# Patient Record
Sex: Male | Born: 1945 | Race: White | Hispanic: No | Marital: Married | Smoking: Former smoker
Health system: Southern US, Community
[De-identification: ages and names within clinical notes are randomized; demographics above are authoritative.]

## PROBLEM LIST (undated history)

## (undated) DIAGNOSIS — F419 Anxiety disorder, unspecified: Secondary | ICD-10-CM

## (undated) DIAGNOSIS — K219 Gastro-esophageal reflux disease without esophagitis: Secondary | ICD-10-CM

## (undated) DIAGNOSIS — M1990 Unspecified osteoarthritis, unspecified site: Secondary | ICD-10-CM

## (undated) DIAGNOSIS — N189 Chronic kidney disease, unspecified: Secondary | ICD-10-CM

## (undated) DIAGNOSIS — I4891 Unspecified atrial fibrillation: Secondary | ICD-10-CM

## (undated) DIAGNOSIS — I10 Essential (primary) hypertension: Secondary | ICD-10-CM

## (undated) DIAGNOSIS — M109 Gout, unspecified: Secondary | ICD-10-CM

## (undated) DIAGNOSIS — I509 Heart failure, unspecified: Secondary | ICD-10-CM

## (undated) HISTORY — PX: PARS PLANA VITRECTOMY: SHX2166

## (undated) HISTORY — PX: CARDIAC VALVE REPLACEMENT: SHX585

## (undated) HISTORY — PX: AORTIC VALVE REPLACEMENT: SHX41

## (undated) HISTORY — PX: TONSILLECTOMY: SUR1361

## (undated) HISTORY — PX: PILONIDAL CYST DRAINAGE: SHX743

## (undated) HISTORY — PX: SHOULDER ARTHROSCOPY W/ BANKART PROCEDURE: SHX2397

---

## 2005-12-07 ENCOUNTER — Ambulatory Visit: Payer: Self-pay | Admitting: Gastroenterology

## 2005-12-14 ENCOUNTER — Ambulatory Visit: Payer: Self-pay | Admitting: Gastroenterology

## 2008-10-26 ENCOUNTER — Emergency Department (HOSPITAL_BASED_OUTPATIENT_CLINIC_OR_DEPARTMENT_OTHER): Admission: EM | Admit: 2008-10-26 | Discharge: 2008-10-26 | Payer: Self-pay | Admitting: Emergency Medicine

## 2008-10-26 ENCOUNTER — Ambulatory Visit: Payer: Self-pay | Admitting: Diagnostic Radiology

## 2009-04-01 ENCOUNTER — Encounter: Admission: RE | Admit: 2009-04-01 | Discharge: 2009-04-01 | Payer: Self-pay | Admitting: Chiropractor

## 2012-03-26 ENCOUNTER — Emergency Department (HOSPITAL_BASED_OUTPATIENT_CLINIC_OR_DEPARTMENT_OTHER)
Admission: EM | Admit: 2012-03-26 | Discharge: 2012-03-26 | Disposition: A | Discharge status: Home / Self Care | Payer: Medicare Other | Attending: Emergency Medicine | Admitting: Emergency Medicine

## 2012-03-26 ENCOUNTER — Encounter (HOSPITAL_BASED_OUTPATIENT_CLINIC_OR_DEPARTMENT_OTHER): Payer: Self-pay | Admitting: *Deleted

## 2012-03-26 DIAGNOSIS — Z87891 Personal history of nicotine dependence: Secondary | ICD-10-CM | POA: Insufficient documentation

## 2012-03-26 DIAGNOSIS — S61209A Unspecified open wound of unspecified finger without damage to nail, initial encounter: Secondary | ICD-10-CM | POA: Insufficient documentation

## 2012-03-26 DIAGNOSIS — I10 Essential (primary) hypertension: Secondary | ICD-10-CM | POA: Insufficient documentation

## 2012-03-26 DIAGNOSIS — I509 Heart failure, unspecified: Secondary | ICD-10-CM | POA: Insufficient documentation

## 2012-03-26 DIAGNOSIS — W260XXA Contact with knife, initial encounter: Secondary | ICD-10-CM | POA: Insufficient documentation

## 2012-03-26 DIAGNOSIS — S61219A Laceration without foreign body of unspecified finger without damage to nail, initial encounter: Secondary | ICD-10-CM

## 2012-03-26 DIAGNOSIS — Z7901 Long term (current) use of anticoagulants: Secondary | ICD-10-CM | POA: Insufficient documentation

## 2012-03-26 DIAGNOSIS — Z7982 Long term (current) use of aspirin: Secondary | ICD-10-CM | POA: Insufficient documentation

## 2012-03-26 DIAGNOSIS — W261XXA Contact with sword or dagger, initial encounter: Secondary | ICD-10-CM | POA: Insufficient documentation

## 2012-03-26 HISTORY — DX: Heart failure, unspecified: I50.9

## 2012-03-26 HISTORY — DX: Essential (primary) hypertension: I10

## 2012-03-26 MED ORDER — LIDOCAINE HCL 2 % IJ SOLN
20.0000 mL | Freq: Once | INTRAMUSCULAR | Status: AC
  Administered 2012-03-26: 400 mg via INTRADERMAL

## 2012-03-26 MED ORDER — LIDOCAINE HCL 2 % IJ SOLN
INTRAMUSCULAR | Status: AC
  Administered 2012-03-26: 400 mg via INTRADERMAL
  Filled 2012-03-26: qty 1

## 2012-03-26 NOTE — ED Provider Notes (Signed)
Medical screening examination/treatment/procedure(s) were performed by non-physician practitioner and as supervising physician I was immediately available for consultation/collaboration.   Stepen Prins, MD 03/26/12 1636 

## 2012-03-26 NOTE — ED Notes (Signed)
Padded baseball finger splint applied to index finger; sutures placed via PA; wound cleaned and dressed with gauze and bacitracin.

## 2012-03-26 NOTE — ED Provider Notes (Signed)
History     CSN: [id number]  Arrival date & time 03/26/12  1518   First MD Initiated Contact with Patient 03/26/12 1554      Chief Complaint  Patient presents with  . Laceration    (Consider location/radiation/quality/duration/timing/severity/associated sxs/prior treatment) Patient is a 66 y.o. male presenting with skin laceration. The history is provided by the patient. No language interpreter was used.  Laceration  The incident occurred 1 to 2 hours ago. The laceration is located on the left hand. Size: 7mm. The laceration mechanism was a a clean knife. The pain is at a severity of 0/10. The pain is mild. The pain has been constant since onset. He reports no foreign bodies present. His tetanus status is UTD.  Pt cut finger on a nife while cooking chicken wings  Past Medical History  Diagnosis Date  . CHF (congestive heart failure)   . Hypertension     Past Surgical History  Procedure Date  . Tonsillectomy   . Shoulder arthroscopy w/ bankhart procedure     No family history on file.  History  Substance Use Topics  . Smoking status: Former Games developer  . Smokeless tobacco: Not on file  . Alcohol Use: No      Review of Systems  Skin: Positive for wound.  All other systems reviewed and are negative.    Allergies  Review of patient's allergies indicates no known allergies.  Home Medications   Current Outpatient Rx  Name Route Sig Dispense Refill  . ASPIRIN 81 MG PO TABS Oral Take 81 mg by mouth daily.    . ATORVASTATIN CALCIUM PO Oral Take 0.5 tablets by mouth daily.    Marland Kitchen CITALOPRAM HYDROBROMIDE 40 MG PO TABS Oral Take 20 mg by mouth daily.    Marland Kitchen HYDROCHLOROTHIAZIDE PO Oral Take 1 tablet by mouth every other day.    Marland Kitchen LISINOPRIL PO Oral Take 0.5 tablets by mouth daily.    Marland Kitchen FISH OIL PO Oral Take 2 capsules by mouth 2 (two) times daily.    Marland Kitchen OMEPRAZOLE 20 MG PO CPDR Oral Take 20 mg by mouth daily.    Marland Kitchen COUMADIN PO Oral Take 0.5-1 tablets by mouth daily. 1 tablet  on Monday and Friday and 0.5 tablet all other days      BP 123/74  Pulse 54  Temp 98.3 F (36.8 C) (Oral)  Resp 20  SpO2 94%  Physical Exam  Nursing note and vitals reviewed. Constitutional: He appears well-developed and well-nourished.  Musculoskeletal: He exhibits tenderness.       7mm laceration left index finger oozing  Neurological: He is alert.  Skin: Skin is warm.    ED Course  LACERATION REPAIR Date/Time: 03/26/2012 4:13 PM Performed by: Elson Areas Authorized by: Elson Areas Consent: Verbal consent not obtained. Risks and benefits: risks, benefits and alternatives were discussed Consent given by: patient Patient understanding: patient states understanding of the procedure being performed Patient identity confirmed: verbally with patient Time out: Immediately prior to procedure a "time out" was called to verify the correct patient, procedure, equipment, support staff and site/side marked as required. Body area: lower extremity Laceration length: 0.7 cm Foreign bodies: metal Tendon involvement: none Nerve involvement: none Anesthesia: local infiltration Preparation: Patient was prepped and draped in the usual sterile fashion. Irrigation solution: saline Skin closure: 5-0 Prolene Number of sutures: 2 Technique: simple Approximation difficulty: simple Patient tolerance: Patient tolerated the procedure well with no immediate complications.   (including critical care time)  Labs Reviewed - No data to display No results found.   1. Laceration of finger       MDM  Suture removal in 7 days        Lonia Skinner [place], [place] 03/26/12 1614

## 2012-03-26 NOTE — ED Notes (Signed)
Cut his left index finger with a kitchen knife. Patient on coumadin.

## 2012-04-03 ENCOUNTER — Encounter (HOSPITAL_BASED_OUTPATIENT_CLINIC_OR_DEPARTMENT_OTHER): Payer: Self-pay | Admitting: *Deleted

## 2012-04-03 ENCOUNTER — Emergency Department (HOSPITAL_BASED_OUTPATIENT_CLINIC_OR_DEPARTMENT_OTHER)
Admission: EM | Admit: 2012-04-03 | Discharge: 2012-04-03 | Disposition: A | Discharge status: Home / Self Care | Payer: Medicare Other | Attending: Emergency Medicine | Admitting: Emergency Medicine

## 2012-04-03 DIAGNOSIS — I509 Heart failure, unspecified: Secondary | ICD-10-CM | POA: Insufficient documentation

## 2012-04-03 DIAGNOSIS — Z4802 Encounter for removal of sutures: Secondary | ICD-10-CM | POA: Insufficient documentation

## 2012-04-03 DIAGNOSIS — Z87891 Personal history of nicotine dependence: Secondary | ICD-10-CM | POA: Insufficient documentation

## 2012-04-03 DIAGNOSIS — I10 Essential (primary) hypertension: Secondary | ICD-10-CM | POA: Insufficient documentation

## 2012-04-03 NOTE — ED Provider Notes (Signed)
History     CSN: [id number]  Arrival date & time 04/03/12  1126   First MD Initiated Contact with Patient 04/03/12 1134      Chief Complaint  Patient presents with  . Suture / Staple Removal    (Consider location/radiation/quality/duration/timing/severity/associated sxs/prior treatment) HPI  Left 2nd finger with sutures placed 8 days ago.  No complaints.  Here for suture removal.   Past Medical History  Diagnosis Date  . CHF (congestive heart failure)   . Hypertension     Past Surgical History  Procedure Date  . Tonsillectomy   . Shoulder arthroscopy w/ bankhart procedure     No family history on file.  History  Substance Use Topics  . Smoking status: Former Games developer  . Smokeless tobacco: Not on file  . Alcohol Use: No      Review of Systems  All other systems reviewed and are negative.    Allergies  Review of patient's allergies indicates no known allergies.  Home Medications   Current Outpatient Rx  Name Route Sig Dispense Refill  . ASPIRIN 81 MG PO TABS Oral Take 81 mg by mouth daily.    . ATORVASTATIN CALCIUM PO Oral Take 0.5 tablets by mouth daily.    Marland Kitchen CITALOPRAM HYDROBROMIDE 40 MG PO TABS Oral Take 20 mg by mouth daily.    Marland Kitchen HYDROCHLOROTHIAZIDE PO Oral Take 1 tablet by mouth every other day.    Marland Kitchen LISINOPRIL PO Oral Take 0.5 tablets by mouth daily.    Marland Kitchen FISH OIL PO Oral Take 2 capsules by mouth 2 (two) times daily.    Marland Kitchen OMEPRAZOLE 20 MG PO CPDR Oral Take 20 mg by mouth daily.    Marland Kitchen COUMADIN PO Oral Take 0.5-1 tablets by mouth daily. 1 tablet on Monday and Friday and 0.5 tablet all other days      BP 170/78  Pulse 47  Temp 97.5 F (36.4 C) (Oral)  Resp 18  SpO2 99%  Physical Exam  Nursing note and vitals reviewed. Constitutional: He appears well-developed.  Musculoskeletal:       Left 2nd finger with 3 sutures.  Well healing,  Full arom.  No erythema.     ED Course  Procedures (including critical care time)  Labs Reviewed - No data to  display No results found.   No diagnosis found.    MDM        Hilario Quarry, MD 04/03/12 [phone]

## 2012-04-03 NOTE — ED Notes (Signed)
Sutures x 2 removed from pts left index finger. BP noted states he has not taken his BP medication today but is on his way home to take it now.

## 2012-04-03 NOTE — ED Notes (Signed)
Here for suture removal. Healing laceration to his left index finger. Sutures x 2 intact.

## 2014-01-20 ENCOUNTER — Emergency Department (HOSPITAL_BASED_OUTPATIENT_CLINIC_OR_DEPARTMENT_OTHER): Payer: Medicare Other

## 2014-01-20 ENCOUNTER — Encounter (HOSPITAL_BASED_OUTPATIENT_CLINIC_OR_DEPARTMENT_OTHER): Payer: Self-pay | Admitting: Emergency Medicine

## 2014-01-20 ENCOUNTER — Emergency Department (HOSPITAL_BASED_OUTPATIENT_CLINIC_OR_DEPARTMENT_OTHER)
Admission: EM | Admit: 2014-01-20 | Discharge: 2014-01-20 | Disposition: A | Discharge status: Home / Self Care | Payer: Medicare Other | Attending: Emergency Medicine | Admitting: Emergency Medicine

## 2014-01-20 DIAGNOSIS — Y9289 Other specified places as the place of occurrence of the external cause: Secondary | ICD-10-CM | POA: Diagnosis not present

## 2014-01-20 DIAGNOSIS — Z87891 Personal history of nicotine dependence: Secondary | ICD-10-CM | POA: Insufficient documentation

## 2014-01-20 DIAGNOSIS — I509 Heart failure, unspecified: Secondary | ICD-10-CM | POA: Insufficient documentation

## 2014-01-20 DIAGNOSIS — S0093XA Contusion of unspecified part of head, initial encounter: Secondary | ICD-10-CM

## 2014-01-20 DIAGNOSIS — W1809XA Striking against other object with subsequent fall, initial encounter: Secondary | ICD-10-CM | POA: Diagnosis not present

## 2014-01-20 DIAGNOSIS — Z7901 Long term (current) use of anticoagulants: Secondary | ICD-10-CM | POA: Insufficient documentation

## 2014-01-20 DIAGNOSIS — S0990XA Unspecified injury of head, initial encounter: Secondary | ICD-10-CM | POA: Diagnosis present

## 2014-01-20 DIAGNOSIS — S0003XA Contusion of scalp, initial encounter: Secondary | ICD-10-CM | POA: Diagnosis not present

## 2014-01-20 DIAGNOSIS — I10 Essential (primary) hypertension: Secondary | ICD-10-CM | POA: Diagnosis not present

## 2014-01-20 DIAGNOSIS — S199XXA Unspecified injury of neck, initial encounter: Secondary | ICD-10-CM

## 2014-01-20 DIAGNOSIS — S0083XA Contusion of other part of head, initial encounter: Principal | ICD-10-CM | POA: Insufficient documentation

## 2014-01-20 DIAGNOSIS — S0993XA Unspecified injury of face, initial encounter: Secondary | ICD-10-CM | POA: Insufficient documentation

## 2014-01-20 DIAGNOSIS — S1093XA Contusion of unspecified part of neck, initial encounter: Principal | ICD-10-CM

## 2014-01-20 DIAGNOSIS — I4891 Unspecified atrial fibrillation: Secondary | ICD-10-CM | POA: Insufficient documentation

## 2014-01-20 DIAGNOSIS — Z79899 Other long term (current) drug therapy: Secondary | ICD-10-CM | POA: Diagnosis not present

## 2014-01-20 DIAGNOSIS — W19XXXA Unspecified fall, initial encounter: Secondary | ICD-10-CM

## 2014-01-20 DIAGNOSIS — Y9301 Activity, walking, marching and hiking: Secondary | ICD-10-CM | POA: Insufficient documentation

## 2014-01-20 DIAGNOSIS — R011 Cardiac murmur, unspecified: Secondary | ICD-10-CM | POA: Insufficient documentation

## 2014-01-20 DIAGNOSIS — Z7982 Long term (current) use of aspirin: Secondary | ICD-10-CM | POA: Insufficient documentation

## 2014-01-20 LAB — CBC WITH DIFFERENTIAL/PLATELET
BASOS ABS: 0 10*3/uL (ref 0.0–0.1)
BASOS PCT: 0 % (ref 0–1)
EOS ABS: 0.2 10*3/uL (ref 0.0–0.7)
EOS PCT: 3 % (ref 0–5)
HCT: 44.4 % (ref 39.0–52.0)
Hemoglobin: 15.5 g/dL (ref 13.0–17.0)
Lymphocytes Relative: 18 % (ref 12–46)
Lymphs Abs: 1.3 10*3/uL (ref 0.7–4.0)
MCH: 32.8 pg (ref 26.0–34.0)
MCHC: 34.9 g/dL (ref 30.0–36.0)
MCV: 93.9 fL (ref 78.0–100.0)
Monocytes Absolute: 0.5 10*3/uL (ref 0.1–1.0)
Monocytes Relative: 6 % (ref 3–12)
NEUTROS PCT: 73 % (ref 43–77)
Neutro Abs: 5.3 10*3/uL (ref 1.7–7.7)
PLATELETS: 205 10*3/uL (ref 150–400)
RBC: 4.73 MIL/uL (ref 4.22–5.81)
RDW: 12.9 % (ref 11.5–15.5)
WBC: 7.2 10*3/uL (ref 4.0–10.5)

## 2014-01-20 LAB — PROTIME-INR
INR: 2.71 — AB (ref 0.00–1.49)
PROTHROMBIN TIME: 27.8 s — AB (ref 11.6–15.2)

## 2014-01-20 LAB — BASIC METABOLIC PANEL
BUN: 17 mg/dL (ref 6–23)
CALCIUM: 10 mg/dL (ref 8.4–10.5)
CO2: 22 mEq/L (ref 19–32)
Chloride: 102 mEq/L (ref 96–112)
Creatinine, Ser: 1.3 mg/dL (ref 0.50–1.35)
GFR, EST AFRICAN AMERICAN: 64 mL/min — AB (ref >90)
GFR, EST NON AFRICAN AMERICAN: 55 mL/min — AB (ref >90)
GLUCOSE: 90 mg/dL (ref 70–99)
POTASSIUM: 4 meq/L (ref 3.7–5.3)
SODIUM: 141 meq/L (ref 137–147)

## 2014-01-20 NOTE — ED Notes (Signed)
RN to room for d/c and patient left prior to receiving instructions or obtaining a signature.

## 2014-01-20 NOTE — ED Provider Notes (Signed)
CSN: [id number]     Arrival date & time 01/20/14  1803 History   First MD Initiated Contact with Patient 01/20/14 1914     Chief Complaint  Patient presents with  . Fall     (Consider location/radiation/quality/duration/timing/severity/associated sxs/prior Treatment) HPI Comments: 68 year old male with a history of A. fib on Coumadin who presents after a fall. He states he had a few alcoholic drinks while he was cooking dinner. As he was walking into the kitchen, he lost his balance and fell. He struck his head. Multiple times, he denied syncope. He also denied loss of consciousness. He denied symptoms of presyncope.  Although he did feel "dizzy", he further elaborated on this by describing a feeling of off balance. He had mild right lateral shoulder and neck pain. However, he denied headache or other injuries.   Patient is a 68 y.o. male presenting with fall.  Fall This is a new problem. The current episode started 1 to 2 hours ago. Episode frequency: once. The problem has been resolved. Pertinent negatives include no chest pain, no abdominal pain, no headaches and no shortness of breath. Associated symptoms comments: Dizzy - further described as feeling off balance. Nothing aggravates the symptoms. Nothing relieves the symptoms.    Past Medical History  Diagnosis Date  . CHF (congestive heart failure)   . Hypertension    Past Surgical History  Procedure Laterality Date  . Tonsillectomy    . Shoulder arthroscopy w/ bankart procedure     No family history on file. History  Substance Use Topics  . Smoking status: Former Games developer  . Smokeless tobacco: Not on file  . Alcohol Use: Yes    Review of Systems  Respiratory: Negative for shortness of breath.   Cardiovascular: Negative for chest pain.  Gastrointestinal: Negative for abdominal pain.  Neurological: Negative for headaches.  All other systems reviewed and are negative.     Allergies  Review of patient's allergies  indicates no known allergies.  Home Medications   Prior to Admission medications   Medication Sig Start Date End Date Taking? Authorizing Provider  aspirin 81 MG tablet Take 81 mg by mouth daily.    Historical Provider, MD  ATORVASTATIN CALCIUM PO Take 0.5 tablets by mouth daily.    Historical Provider, MD  citalopram (CELEXA) 40 MG tablet Take 20 mg by mouth daily.    Historical Provider, MD  HYDROCHLOROTHIAZIDE PO Take 1 tablet by mouth every other day.    Historical Provider, MD  LISINOPRIL PO Take 0.5 tablets by mouth daily.    Historical Provider, MD  Omega-3 Fatty Acids (FISH OIL PO) Take 2 capsules by mouth 2 (two) times daily.    Historical Provider, MD  omeprazole (PRILOSEC) 20 MG capsule Take 20 mg by mouth daily.    Historical Provider, MD  Warfarin Sodium (COUMADIN PO) Take 0.5-1 tablets by mouth daily. 1 tablet on Monday and Friday and 0.5 tablet all other days    Historical Provider, MD   BP 139/81  Pulse 58  Temp(Src) 97.9 F (36.6 C) (Oral)  Resp 20  SpO2 97% Physical Exam  Nursing note and vitals reviewed. Constitutional: He is oriented to person, place, and time. He appears well-developed and well-nourished. No distress.  HENT:  Head: Normocephalic and atraumatic.    Mouth/Throat: Oropharynx is clear and moist.  Eyes: Conjunctivae and EOM are normal. Pupils are equal, round, and reactive to light. No scleral icterus.  Neck: Normal range of motion. Neck supple. Muscular tenderness (right  lateral) present. No spinous process tenderness present. Normal range of motion present.  Cardiovascular: Normal rate, regular rhythm and intact distal pulses.   Murmur heard.  Systolic (Right sternal border) murmur is present with a grade of 2/6  Pulmonary/Chest: Effort normal and breath sounds normal. No stridor. No respiratory distress. He has no wheezes. He has no rales.  Abdominal: Soft. He exhibits no distension. There is no tenderness.  Musculoskeletal: Normal range of  motion. He exhibits no edema.  Neurological: He is alert and oriented to person, place, and time.  Skin: Skin is warm and dry. No rash noted.  Psychiatric: He has a normal mood and affect. His behavior is normal.    ED Course  Procedures (including critical care time) Labs Review Labs Reviewed - No data to display  Imaging Review Ct Head Wo Contrast  01/20/2014   CLINICAL DATA:  Fall, hit head.  Head pain.  Neck pain.  EXAM: CT HEAD WITHOUT CONTRAST  CT CERVICAL SPINE WITHOUT CONTRAST  TECHNIQUE: Multidetector CT imaging of the head and cervical spine was performed following the standard protocol without intravenous contrast. Multiplanar CT image reconstructions of the cervical spine were also generated.  COMPARISON:  None.  FINDINGS: CT HEAD FINDINGS  No evidence for acute infarction, hemorrhage, mass lesion, hydrocephalus, or extra-axial fluid. Cerebral and cerebellar atrophy somewhat advanced for age. Hypoattenuation of white matter consistent with chronic microvascular ischemic change.  Calvarium intact. No significant scalp hematoma. No sinus air-fluid level or mastoid fluid. Vascular calcification.  CT CERVICAL SPINE FINDINGS  There is no visible cervical spine fracture, traumatic subluxation, prevertebral soft tissue swelling, or intraspinal hematoma. Mild reversal of the normal cervical lordotic curve could be positional or due to spasm. There is moderately extensive spondylosis with disc space narrowing C3 -C7. No significant pannus. The lung apices demonstrate no pneumothorax. There is mild atherosclerosis.  IMPRESSION: Chronic changes as described.  No acute intracranial abnormality.  Cervical spondylosis. No cervical spine fracture or traumatic subluxation.   Electronically Signed   By: Davonna Belling M.D.   On: 01/20/2014 19:35   Ct Cervical Spine Wo Contrast  01/20/2014   CLINICAL DATA:  Fall, hit head.  Head pain.  Neck pain.  EXAM: CT HEAD WITHOUT CONTRAST  CT CERVICAL SPINE WITHOUT  CONTRAST  TECHNIQUE: Multidetector CT imaging of the head and cervical spine was performed following the standard protocol without intravenous contrast. Multiplanar CT image reconstructions of the cervical spine were also generated.  COMPARISON:  None.  FINDINGS: CT HEAD FINDINGS  No evidence for acute infarction, hemorrhage, mass lesion, hydrocephalus, or extra-axial fluid. Cerebral and cerebellar atrophy somewhat advanced for age. Hypoattenuation of white matter consistent with chronic microvascular ischemic change.  Calvarium intact. No significant scalp hematoma. No sinus air-fluid level or mastoid fluid. Vascular calcification.  CT CERVICAL SPINE FINDINGS  There is no visible cervical spine fracture, traumatic subluxation, prevertebral soft tissue swelling, or intraspinal hematoma. Mild reversal of the normal cervical lordotic curve could be positional or due to spasm. There is moderately extensive spondylosis with disc space narrowing C3 -C7. No significant pannus. The lung apices demonstrate no pneumothorax. There is mild atherosclerosis.  IMPRESSION: Chronic changes as described.  No acute intracranial abnormality.  Cervical spondylosis. No cervical spine fracture or traumatic subluxation.   Electronically Signed   By: Davonna Belling M.D.   On: 01/20/2014 19:35     EKG Interpretation   Date/Time:  Sunday January 20 2014 20:03:36 EDT Ventricular Rate:  52 PR Interval:  174 QRS Duration: 106 QT Interval:  480 QTC Calculation: 446 R Axis:   2 Text Interpretation:  Sinus bradycardia T wave abnormality, consider  lateral ischemia Abnormal ECG No old tracing to compare Confirmed by  [facility] LLCWOFFORD  MD, TREY ([id number]) on 01/20/2014 11:36:16 PM      MDM   Final diagnoses:  Fall  Head contusion    68 year old male who fell, striking his head. On Coumadin, INR therapeutic. Head CT negative for intracranial hemorrhage or other acute process. EKG shows T-wave inversions.  [facility],  which shows these T-wave inversions are old. On repeated questioning, he denies any symptoms that would be concerning for presyncope or syncope. His feeling of off balance was likely secondary to his alcohol intoxication. In the ED, he had a normal steady gait.  Discharged home with good return precautions. Advised PCP followup.    Candyce ChurnJohn David Burnis Halling III, MD 01/20/14 [phone]

## 2014-01-20 NOTE — ED Notes (Signed)
MD at bedside. 

## 2014-01-20 NOTE — ED Notes (Addendum)
After having a few drinks, patient slipped and fell, hitting head. Per EMS patient had LOC for a few seconds, alert and oriented. SOB is baseline for patient, aortic vale surgery scheduled for later this month. C/o minor neck pain. cbg 189 on truck

## 2014-01-20 NOTE — Discharge Instructions (Signed)

## 2014-03-18 ENCOUNTER — Encounter: Payer: Self-pay | Admitting: Specialist

## 2014-03-18 ENCOUNTER — Encounter: Payer: Self-pay | Admitting: Gastroenterology

## 2014-03-18 ENCOUNTER — Encounter: Payer: Self-pay | Admitting: *Deleted

## 2015-05-16 ENCOUNTER — Emergency Department (HOSPITAL_BASED_OUTPATIENT_CLINIC_OR_DEPARTMENT_OTHER)
Admission: EM | Admit: 2015-05-16 | Discharge: 2015-05-16 | Disposition: A | Discharge status: Home / Self Care | Payer: Medicare Other | Attending: Emergency Medicine | Admitting: Emergency Medicine

## 2015-05-16 ENCOUNTER — Encounter (HOSPITAL_BASED_OUTPATIENT_CLINIC_OR_DEPARTMENT_OTHER): Payer: Self-pay

## 2015-05-16 DIAGNOSIS — Y92810 Car as the place of occurrence of the external cause: Secondary | ICD-10-CM | POA: Insufficient documentation

## 2015-05-16 DIAGNOSIS — Z79899 Other long term (current) drug therapy: Secondary | ICD-10-CM | POA: Insufficient documentation

## 2015-05-16 DIAGNOSIS — Z7901 Long term (current) use of anticoagulants: Secondary | ICD-10-CM | POA: Insufficient documentation

## 2015-05-16 DIAGNOSIS — Z87891 Personal history of nicotine dependence: Secondary | ICD-10-CM | POA: Insufficient documentation

## 2015-05-16 DIAGNOSIS — Y9389 Activity, other specified: Secondary | ICD-10-CM | POA: Insufficient documentation

## 2015-05-16 DIAGNOSIS — S61210A Laceration without foreign body of right index finger without damage to nail, initial encounter: Secondary | ICD-10-CM | POA: Diagnosis not present

## 2015-05-16 DIAGNOSIS — I509 Heart failure, unspecified: Secondary | ICD-10-CM | POA: Diagnosis not present

## 2015-05-16 DIAGNOSIS — Z7982 Long term (current) use of aspirin: Secondary | ICD-10-CM | POA: Insufficient documentation

## 2015-05-16 DIAGNOSIS — Y998 Other external cause status: Secondary | ICD-10-CM | POA: Insufficient documentation

## 2015-05-16 DIAGNOSIS — Y288XXA Contact with other sharp object, undetermined intent, initial encounter: Secondary | ICD-10-CM | POA: Diagnosis not present

## 2015-05-16 DIAGNOSIS — I10 Essential (primary) hypertension: Secondary | ICD-10-CM | POA: Insufficient documentation

## 2015-05-16 DIAGNOSIS — S6991XA Unspecified injury of right wrist, hand and finger(s), initial encounter: Secondary | ICD-10-CM | POA: Diagnosis present

## 2015-05-16 NOTE — Discharge Instructions (Signed)
Please read and follow all provided instructions.  Your diagnoses today include:  1. Laceration of right index finger w/o foreign body w/o damage to nail, initial encounter    Tests performed today include:  Vital signs. See below for your results today.   Medications prescribed:   None   Take any prescribed medications only as directed.   Home care instructions:  Follow any educational materials and wound care instructions contained in this packet.   Keep affected area above the level of your heart when possible to minimize swelling. Wash area gently twice a day with warm soapy water. Do not apply alcohol or hydrogen peroxide. Cover the area if it draining or weeping.   Return instructions:  Return to the Emergency Department if you have:  Fever  Worsening pain  Worsening swelling of the wound  Pus draining from the wound  Redness of the skin that moves away from the wound, especially if it streaks away from the affected area   Any other emergent concerns  Your vital signs today were: BP 136/84 mmHg   Pulse 69   Temp(Src) 98.6 F (37 C) (Oral)   Ht  (1.753 m)   Wt 250 lb (113.399 kg)   BMI 36.90 kg/m2   SpO2 99% If your blood pressure (BP) was elevated above 135/85 this visit, please have this repeated by your doctor within one month. --------------

## 2015-05-16 NOTE — ED Provider Notes (Signed)
CSN: [id number]     Arrival date & time 05/16/15  1336 History   First MD Initiated Contact with Patient 05/16/15 1400     Chief Complaint  Patient presents with  . 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 Patient cut the finger on a piece of metal. He states that he initially cleaned with water. Bleeding was minimal. He put a bandage on it. Last night the patient played the piano without any difficulty. He presents today with an open wound. Tetanus was updated approximately one year ago. No other treatments prior to arrival. No drainage or discharge, redness, or increased pain today. Patient does not use steroids medications and does not have a history of diabetes or other immunocompromising states.  The history is provided by the patient.    Past Medical History  Diagnosis Date  . CHF (congestive heart failure)   . Hypertension    Past Surgical History  Procedure Laterality Date  . Tonsillectomy    . Shoulder arthroscopy w/ bankart procedure    . Cardiac valve replacement     No family history on file. Social History  Substance Use Topics  . Smoking status: Former Games developer  . Smokeless tobacco: None  . Alcohol Use: Yes    Review of Systems  Constitutional: Negative for fever.  Musculoskeletal: Negative for myalgias and arthralgias.  Skin: Positive for wound. Negative for color change.  Neurological: Negative for weakness.      Allergies  Review of patient's allergies indicates no known allergies.  Home Medications   Prior to Admission medications   Medication Sig Start Date End Date Taking? Authorizing Provider  METOPROLOL SUCCINATE ER PO Take by mouth.   Yes Historical Provider, MD  aspirin 81 MG  tablet Take 81 mg by mouth daily.    Historical Provider, MD  ATORVASTATIN CALCIUM PO Take 0.5 tablets by mouth daily.    Historical Provider, MD  citalopram (CELEXA) 40 MG tablet Take 20 mg by mouth daily.    Historical Provider, MD  LISINOPRIL PO Take 0.5 tablets by mouth daily.    Historical Provider, MD  Omega-3 Fatty Acids (FISH OIL PO) Take 2 capsules by mouth 2 (two) times daily.    Historical Provider, MD  omeprazole (PRILOSEC) 20 MG capsule Take 20 mg by mouth daily.    Historical Provider, MD  Warfarin Sodium (COUMADIN PO) Take 0.5-1 tablets by mouth daily. 1 tablet on Monday and Friday and 0.5 tablet all other days    Historical Provider, MD   BP 136/84 mmHg  Pulse 69  Temp(Src) 98.6 F (37 C) (Oral)  Ht  (1.753 m)  Wt 250 lb (113.399 kg)  BMI 36.90 kg/m2  SpO2 99% Physical Exam  Constitutional: He appears well-developed and well-nourished.  HENT:  Head: Normocephalic and atraumatic.  Eyes: Conjunctivae are normal.  Neck: Normal range of motion. Neck supple.  Pulmonary/Chest: No respiratory distress.  Neurological: He is alert.  Skin: Skin is warm and dry.  Patient with mildly gaping 1 cm linear laceration, open, to the volar aspect of the left index finger just proximal to the distal interphalangeal joint. No foreign bodies. Patient is able to fully flex the finger at all joints without significant pain. Base of wound appears clean.  Psychiatric: He has a normal  mood and affect.  Nursing note and vitals reviewed.   ED Course  Procedures (including critical care time) Labs Review Labs Reviewed - No data to display  Imaging Review No results found. I have personally reviewed and evaluated these images and lab results as part of my medical decision-making.   EKG Interpretation None       2:49 PM Patient seen and examined. Wound cleaned with dermal cleanser.  Vital signs reviewed and are as follows: BP 136/84 mmHg  Pulse 69  Temp(Src) 98.6 F (37 C)  (Oral)  Ht  (1.753 m)  Wt 250 lb (113.399 kg)  BMI 36.90 kg/m2  SpO2 99%  Discussed with patient that closure at this point is contraindicated due to elapsed time since injury. Patient counseled on wound care and signs and symptoms to return. He will keep. Bandaged while it is open.  Pt urged to return with worsening pain, worsening swelling, expanding area of redness or streaking up extremity, fever, or any other concerns. Pt verbalizes understanding and agrees with plan.   MDM   Final diagnoses:  Laceration of right index finger w/o foreign body w/o damage to nail, initial encounter   Patient with open laceration of right index finger. Do not suspect bony, vascular injury. Do not suspect tendon injury. Do not suspect foreign body. Given wound occurred greater than 12 hours ago, would not close at this time. Patient is up-to-date on his tetanus. He is no immune compromising conditions to indicate antibiotics.    Renne Crigler, PA-C 05/16/15 1454  Nelva Nay, MD 05/18/15 1011

## 2015-05-16 NOTE — ED Notes (Signed)
Cut right index finger on hatchback handle yesterday 11am

## 2015-12-18 ENCOUNTER — Encounter (HOSPITAL_BASED_OUTPATIENT_CLINIC_OR_DEPARTMENT_OTHER): Payer: Self-pay | Admitting: *Deleted

## 2015-12-18 ENCOUNTER — Emergency Department (HOSPITAL_BASED_OUTPATIENT_CLINIC_OR_DEPARTMENT_OTHER)
Admission: EM | Admit: 2015-12-18 | Discharge: 2015-12-18 | Disposition: A | Discharge status: Home / Self Care | Payer: Medicare Other | Attending: Emergency Medicine | Admitting: Emergency Medicine

## 2015-12-18 DIAGNOSIS — Z7982 Long term (current) use of aspirin: Secondary | ICD-10-CM | POA: Diagnosis not present

## 2015-12-18 DIAGNOSIS — Z87891 Personal history of nicotine dependence: Secondary | ICD-10-CM | POA: Diagnosis not present

## 2015-12-18 DIAGNOSIS — Z79899 Other long term (current) drug therapy: Secondary | ICD-10-CM | POA: Diagnosis not present

## 2015-12-18 DIAGNOSIS — I509 Heart failure, unspecified: Secondary | ICD-10-CM | POA: Diagnosis not present

## 2015-12-18 DIAGNOSIS — M109 Gout, unspecified: Secondary | ICD-10-CM

## 2015-12-18 DIAGNOSIS — M79672 Pain in left foot: Secondary | ICD-10-CM | POA: Diagnosis present

## 2015-12-18 DIAGNOSIS — I110 Hypertensive heart disease with heart failure: Secondary | ICD-10-CM | POA: Insufficient documentation

## 2015-12-18 MED ORDER — HYDROCODONE-ACETAMINOPHEN 5-325 MG PO TABS: 1.0000 | ORAL_TABLET | Freq: Four times a day (QID) | ORAL | Status: AC | PRN

## 2015-12-18 MED ORDER — PREDNISONE 20 MG PO TABS: 40.0000 mg | ORAL_TABLET | Freq: Every day | ORAL | Status: DC

## 2015-12-18 MED FILL — HYDROCODON-APAP 5-325: 5-325 | 2 days supply | Qty: 12 | Fill #0

## 2015-12-18 MED FILL — predniSONE 20 MG TABS: 20 | 5 days supply | Qty: 10 | Fill #0

## 2015-12-18 NOTE — ED Notes (Signed)
Pt reports pain in ball of left foot since Saturday. Denies injury. Ambulatory with cane today due to pain

## 2015-12-18 NOTE — ED Provider Notes (Signed)
CSN: [id number]     Arrival date & time 12/18/15  [id number] History   First MD Initiated Contact with Patient 12/18/15 1006     Chief Complaint  Patient presents with  . Foot Pain     (Consider location/radiation/quality/duration/timing/severity/associated sxs/prior Treatment) HPI Comments: Patient presents to the emergency department with chief complaint of left foot pain. He states that the pain started on Saturday, and has been progressively worsening.  He states that he has had this problem before. It comes intermittently. He denies any associated fevers or chills. He states the pain is mostly located over his left great toe and midfoot. It is worsened with palpation and movement and walking. He has not taken anything for his symptoms.  The history is provided by the patient. No language interpreter was used.    Past Medical History  Diagnosis Date  . CHF (congestive heart failure) (HCC)   . Hypertension    Past Surgical History  Procedure Laterality Date  . Tonsillectomy    . Shoulder arthroscopy w/ bankart procedure    . Cardiac valve replacement    . Pilonidal cyst drainage     No family history on file. Social History  Substance Use Topics  . Smoking status: Former Games developermoker  . Smokeless tobacco: Never Used  . Alcohol Use: Yes     Comment: 2 beers/ day    Review of Systems  Constitutional: Negative for fever and chills.  Respiratory: Negative for shortness of breath.   Cardiovascular: Negative for chest pain.  Gastrointestinal: Negative for nausea, vomiting, diarrhea and constipation.  Genitourinary: Negative for dysuria.  Musculoskeletal: Positive for arthralgias.      Allergies  Review of patient's allergies indicates no known allergies.  Home Medications   Prior to Admission medications   Medication Sig Start Date End Date Taking? Authorizing Provider  aspirin 81 MG tablet Take 81 mg by mouth daily.   Yes Historical Provider, MD  ATORVASTATIN CALCIUM PO Take  40 mg by mouth daily.    Yes Historical Provider, MD  citalopram (CELEXA) 40 MG tablet Take 20 mg by mouth daily.   Yes Historical Provider, MD  lisinopril-hydrochlorothiazide (PRINZIDE,ZESTORETIC) 10-12.5 MG tablet Take 1 tablet by mouth daily.   Yes Historical Provider, MD  Omega-3 Fatty Acids (FISH OIL PO) Take 2 capsules by mouth 2 (two) times daily.   Yes Historical Provider, MD  omeprazole (PRILOSEC) 20 MG capsule Take 20 mg by mouth daily.   Yes Historical Provider, MD  Vitamin D, Cholecalciferol, 1000 units CAPS Take by mouth.   Yes Historical Provider, MD  Warfarin Sodium (COUMADIN PO) Take 3 mg by mouth daily.    Yes Historical Provider, MD  LISINOPRIL PO Take 0.5 tablets by mouth daily.    Historical Provider, MD  METOPROLOL SUCCINATE ER PO Take 37.5 mg by mouth 2 (two) times daily.     Historical Provider, MD   BP 121/88 mmHg  Pulse 72  Temp(Src) 98.6 F (37 C) (Oral)  Resp 18  Ht 5\' 9"  (1.753 m)  Wt 114.533 kg  BMI 37.27 kg/m2  SpO2 98% Physical Exam Physical Exam  Constitutional: Pt appears well-developed and well-nourished. No distress.  HENT:  Head: Normocephalic and atraumatic.  Eyes: Conjunctivae are normal.  Neck: Normal range of motion.  Cardiovascular: Normal rate, regular rhythm and intact distal pulses, +murmur.   Capillary refill < 3 sec  Pulmonary/Chest: Effort normal and breath sounds normal.  Musculoskeletal: Pt exhibits tendernessTo palpation over left great toe. Pt exhibits  no edema.  ROM: 5/5  Neurological: Pt  is alert. Coordination normal.  Sensation 5/5 Strength 5/5  Skin: Skin is warm and dry. Pt is not diaphoretic.  No tenting of the skin  Mild erythema about the left great toe, no evidence of abscess  Psychiatric: Pt has a normal mood and affect.  Nursing note and vitals reviewed.  ED Course  Procedures (including critical care time)   MDM   Final diagnoses:  Acute gout of left foot, unspecified cause    Patient with four-day  history of left great toe/foot pain. Suspect gout. Patient is afebrile. He is not diabetic. He has a history of similar symptoms coming intermittently, but has never been seen by his doctor for this. I see no evidence of septic joint. Discussed with Dr. Manus Gunning. Will treat with pain medicine and prednisone. He has primary care follow-up tomorrow.    Roxy Horseman, PA-C 12/18/15 1037  Glynn Octave, MD 12/18/15 [phone]

## 2015-12-18 NOTE — Discharge Instructions (Signed)

## 2016-03-28 DIAGNOSIS — M5413 Radiculopathy, cervicothoracic region: Secondary | ICD-10-CM | POA: Diagnosis not present

## 2016-03-28 DIAGNOSIS — R209 Unspecified disturbances of skin sensation: Secondary | ICD-10-CM | POA: Diagnosis not present

## 2017-10-28 ENCOUNTER — Emergency Department (HOSPITAL_BASED_OUTPATIENT_CLINIC_OR_DEPARTMENT_OTHER)
Admission: EM | Admit: 2017-10-28 | Discharge: 2017-10-28 | Disposition: A | Discharge status: Home / Self Care | Payer: Medicare Other | Attending: Emergency Medicine | Admitting: Emergency Medicine

## 2017-10-28 ENCOUNTER — Encounter (HOSPITAL_BASED_OUTPATIENT_CLINIC_OR_DEPARTMENT_OTHER): Payer: Self-pay | Admitting: Emergency Medicine

## 2017-10-28 ENCOUNTER — Emergency Department (HOSPITAL_BASED_OUTPATIENT_CLINIC_OR_DEPARTMENT_OTHER): Payer: Medicare Other

## 2017-10-28 ENCOUNTER — Other Ambulatory Visit: Payer: Self-pay

## 2017-10-28 DIAGNOSIS — Z7901 Long term (current) use of anticoagulants: Secondary | ICD-10-CM | POA: Diagnosis not present

## 2017-10-28 DIAGNOSIS — I110 Hypertensive heart disease with heart failure: Secondary | ICD-10-CM | POA: Insufficient documentation

## 2017-10-28 DIAGNOSIS — Y999 Unspecified external cause status: Secondary | ICD-10-CM | POA: Insufficient documentation

## 2017-10-28 DIAGNOSIS — Y9389 Activity, other specified: Secondary | ICD-10-CM | POA: Insufficient documentation

## 2017-10-28 DIAGNOSIS — I509 Heart failure, unspecified: Secondary | ICD-10-CM | POA: Diagnosis not present

## 2017-10-28 DIAGNOSIS — X509XXA Other and unspecified overexertion or strenuous movements or postures, initial encounter: Secondary | ICD-10-CM | POA: Diagnosis not present

## 2017-10-28 DIAGNOSIS — S8991XA Unspecified injury of right lower leg, initial encounter: Secondary | ICD-10-CM | POA: Diagnosis present

## 2017-10-28 DIAGNOSIS — Z87891 Personal history of nicotine dependence: Secondary | ICD-10-CM | POA: Diagnosis not present

## 2017-10-28 DIAGNOSIS — Z79899 Other long term (current) drug therapy: Secondary | ICD-10-CM | POA: Insufficient documentation

## 2017-10-28 DIAGNOSIS — Y92002 Bathroom of unspecified non-institutional (private) residence single-family (private) house as the place of occurrence of the external cause: Secondary | ICD-10-CM | POA: Insufficient documentation

## 2017-10-28 DIAGNOSIS — S93691A Other sprain of right foot, initial encounter: Secondary | ICD-10-CM | POA: Diagnosis not present

## 2017-10-28 DIAGNOSIS — Z7982 Long term (current) use of aspirin: Secondary | ICD-10-CM | POA: Insufficient documentation

## 2017-10-28 DIAGNOSIS — S86011A Strain of right Achilles tendon, initial encounter: Secondary | ICD-10-CM | POA: Insufficient documentation

## 2017-10-28 MED ORDER — ACETAMINOPHEN 500 MG PO TABS: 1000.0000 mg | ORAL_TABLET | Freq: Once | ORAL | Status: DC

## 2017-10-28 MED ORDER — DICLOFENAC SODIUM 1 % TD GEL: 4.0000 g | Freq: Four times a day (QID) | TRANSDERMAL | 0 refills | Status: DC

## 2017-10-28 MED FILL — DICLOFENAC SODIUM 1% GEL: 1 | 7 days supply | Qty: 100 | Fill #0

## 2017-10-28 NOTE — Discharge Instructions (Signed)
We recommend the use of an ankle brace as provided to you in the emergency department for added stability to your ankle.  Apply topical Voltaren gel to the area of injury to help limit inflammation.  You may supplement this with Tylenol as needed for pain management.  Follow-up with an orthopedist to ensure resolution of symptoms.  You may also follow-up with your primary care doctor in a week if symptoms persist.

## 2017-10-28 NOTE — ED Provider Notes (Signed)
[facility] EMERGENCY DEPARTMENT Provider Note   CSN: [id number] Arrival date & time: 10/28/17  1225     History   Chief Complaint Chief Complaint  Patient presents with  . Foot Pain    HPI Nathan Schrader. is a 72 y.o. male.   72 year old male with a history of CHF and hypertension presents to the emergency department for complaints of right ankle pain.  Patient states that he was stepping out of the tub yesterday when he felt sudden onset pain in his right ankle.  Pain is constant, nonradiating and aggravated with ambulation.  He has not taken any medications for his symptoms.  No associated numbness or paresthesias.  He denies prior ankle injury.  He is concerned that he may have injured his Achilles tendon.  The patient is not actively followed by an orthopedist.      Past Medical History:  Diagnosis Date  . CHF (congestive heart failure) (HCC)   . Hypertension     There are no active problems to display for this patient.   Past Surgical History:  Procedure Laterality Date  . CARDIAC VALVE REPLACEMENT    . PILONIDAL CYST DRAINAGE    . SHOULDER ARTHROSCOPY W/ BANKART PROCEDURE    . TONSILLECTOMY         Home Medications    Prior to Admission medications   Medication Sig Start Date End Date Taking? Authorizing Provider  aspirin 81 MG tablet Take 81 mg by mouth daily.    [provider]  ATORVASTATIN CALCIUM PO Take 40 mg by mouth daily.     [provider]  citalopram (CELEXA) 40 MG tablet Take 20 mg by mouth daily.    [provider]  diclofenac sodium (VOLTAREN) 1 % GEL Apply 4 g topically 4 (four) times daily. 10/28/17   Antony Madura, PA-C  HYDROcodone-acetaminophen (NORCO/VICODIN) 5-325 MG tablet Take 1-2 tablets by mouth every 6 (six) hours as needed. 12/18/15   Roxy Horseman, PA-C  LISINOPRIL PO Take 0.5 tablets by mouth daily.    [provider]  lisinopril-hydrochlorothiazide (PRINZIDE,ZESTORETIC)  10-12.5 MG tablet Take 1 tablet by mouth daily.    [provider]  METOPROLOL SUCCINATE ER PO Take 37.5 mg by mouth 2 (two) times daily.     [provider]  Omega-3 Fatty Acids (FISH OIL PO) Take 2 capsules by mouth 2 (two) times daily.    [provider]  omeprazole (PRILOSEC) 20 MG capsule Take 20 mg by mouth daily.    [provider]  predniSONE (DELTASONE) 20 MG tablet Take 2 tablets (40 mg total) by mouth daily. 12/18/15   Roxy Horseman, PA-C  Vitamin D, Cholecalciferol, 1000 units CAPS Take by mouth.    [provider]  Warfarin Sodium (COUMADIN PO) Take 3 mg by mouth daily.     [provider]    Family History History reviewed. No pertinent family history.  Social History Social History   Tobacco Use  . Smoking status: Former Games developer  . Smokeless tobacco: Never Used  Substance Use Topics  . Alcohol use: Yes    Comment: 2 beers/ day  . Drug use: No     Allergies   Patient has no known allergies.   Review of Systems Review of Systems Ten systems reviewed and are negative for acute change, except as noted in the HPI.    Physical Exam Updated Vital Signs BP 126/81 (BP Location: Left Arm)   Pulse 67  Temp 99 F (37.2 C) (Oral)   Resp 18   Ht 5\' 9"  (1.753 m)   Wt 111.1 kg (245 lb)   SpO2 100%   BMI 36.18 kg/m   Physical Exam  Constitutional: He is oriented to person, place, and time. He appears well-developed and well-nourished. No distress.  Pleasant and well appearing, in NAD  HENT:  Head: Normocephalic and atraumatic.  Eyes: Conjunctivae and EOM are normal. No scleral icterus.  Neck: Normal range of motion.  Cardiovascular: Normal rate, regular rhythm and intact distal pulses.  DP pulse 2+ in the RLE  Pulmonary/Chest: Effort normal. No respiratory distress.  Respirations even and unlabored  Musculoskeletal: Normal range of motion.  Normal ROM of the R ankle, but discomfort with dorsiflexion of the  R ankle. TTP posterior to the medial malleolus as well as to the achilles tendon. Negative Thompson's test. No bony deformity, crepitus, erythema, heat to touch. Minimal soft tissue swelling.  Neurological: He is alert and oriented to person, place, and time. He exhibits normal muscle tone. Coordination normal.  Noted to be ambulatory with cane in triage. Sensation to light touch intact in the RLE.  Skin: Skin is warm and dry. No rash noted. He is not diaphoretic. No erythema. No pallor.  Psychiatric: He has a normal mood and affect. His behavior is normal.  Nursing note and vitals reviewed.    ED Treatments / Results  Labs (all labs ordered are listed, but only abnormal results are displayed) Labs Reviewed - No data to display  EKG  EKG Interpretation None       Radiology Dg Ankle Complete Right  Result Date: 10/28/2017 CLINICAL DATA:  Ankle pain secondary to an injury getting out of the bath tub yesterday. EXAM: RIGHT ANKLE - COMPLETE 3+ VIEW COMPARISON:  None. FINDINGS: There is no evidence of fracture, dislocation, or joint effusion. There is no evidence of arthropathy or other focal bone abnormality. Soft tissues are unremarkable. IMPRESSION: Negative. Electronically Signed   By: Francene BoyersJames  Maxwell M.D.   On: 10/28/2017 13:01    Procedures Procedures (including critical care time)  Medications Ordered in ED Medications  acetaminophen (TYLENOL) tablet 1,000 mg (not administered)     Initial Impression / Assessment and Plan / ED Course  I have reviewed the triage vital signs and the nursing notes.  Pertinent labs & imaging results that were available during my care of the patient were reviewed by me and considered in my medical decision making (see chart for details).     Patient presents to the emergency department for evaluation of R ankle pain. Most pain reproducible on palpation of the distal achilles. Negative Thompson's test; doubt achilles rupture. Patient  neurovascularly intact on exam. Imaging negative for fracture, dislocation, bony deformity. Plan for supportive management including RICE and topical NSAIDs; primary care follow up as needed. Patient was provided with an Orthopedic referral for PRN follow up as well. Return precautions discussed and provided. Patient discharged in stable condition with no unaddressed concerns.   Final Clinical Impressions(s) / ED Diagnoses   Final diagnoses:  Achilles tendon sprain, right, initial encounter    ED Discharge Orders        Ordered    diclofenac sodium (VOLTAREN) 1 % GEL  4 times daily     10/28/17 1318       Antony MaduraHumes, Talon Witting, PA-C 10/28/17 1412    Terrilee FilesButler, Michael C, MD 10/29/17 [phone]

## 2017-10-28 NOTE — ED Triage Notes (Signed)
Patient states that he stepped out of the tub yesterday and hurt his right heel  - walking with a cane to triage

## 2017-11-01 ENCOUNTER — Other Ambulatory Visit: Payer: Self-pay

## 2017-11-10 ENCOUNTER — Ambulatory Visit (INDEPENDENT_AMBULATORY_CARE_PROVIDER_SITE_OTHER): Payer: Medicare Other | Admitting: Family Medicine

## 2017-11-10 ENCOUNTER — Encounter: Payer: Self-pay | Admitting: Family Medicine

## 2017-11-10 DIAGNOSIS — S86011A Strain of right Achilles tendon, initial encounter: Secondary | ICD-10-CM | POA: Diagnosis not present

## 2017-11-10 NOTE — Patient Instructions (Signed)
You strained your achilles tendon. Use voltaren gel only as needed up to 4 times a day. Calf raises 3 sets of 10 on level ground once a day first. When these are easy, can do them one legged 3 sets of 10. Do home exercises for 4 weeks then you can stop. Ice bucket 10-15 minutes at end of day - can ice 3-4 times a day if needed. Avoid uneven ground, hills as much as possible. Heel lifts in shoes or shoes with a natural heel lift for at least the next 2 weeks Consider physical therapy, orthotics, nitro patches if not improving as expected. Follow up in 1 month or as needed if you're doing well as I expect.

## 2017-11-11 ENCOUNTER — Encounter: Payer: Self-pay | Admitting: Family Medicine

## 2017-11-11 DIAGNOSIS — S86011A Strain of right Achilles tendon, initial encounter: Secondary | ICD-10-CM | POA: Insufficient documentation

## 2017-11-11 NOTE — Assessment & Plan Note (Signed)
clinically much improved.  Shown home exercises to do daily.  Voltaren gel, icing if needed.  Heel lifts.  Avoid flat shoes, inclines.  F/u in 1 month or prn.

## 2017-11-11 NOTE — Progress Notes (Signed)
PCP: System, Pcp Not In  Subjective:   HPI: Patient is a 72 y.o. male here for right heel pain.  Patient reports he's had posterior right heel pain since 3/14. 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 Had to stand on right foot and use volume pedal with his left. No pain while doing this but reports later that night or the next day he stepped hard and pulled posterior heel area getting out of the shower. 2 days later this was very painful. Has tried voltaren gel, icing, ASO. Feels better especially past couple days, pain currently 0/10. Was worse with walking. No skin changes, numbness.  Past Medical History:  Diagnosis Date  . CHF (congestive heart failure) (HCC)   . Hypertension     Current Outpatient Medications on File Prior to Visit  Medication Sig Dispense Refill  . aspirin 81 MG tablet Take 81 mg by mouth daily.    . ATORVASTATIN CALCIUM PO Take 40 mg by mouth daily.     . citalopram (CELEXA) 40 MG tablet Take 20 mg by mouth daily.    . diclofenac sodium (VOLTAREN) 1 % GEL Apply 4 g topically 4 (four) times daily. 100 g 0  . HYDROcodone-acetaminophen (NORCO/VICODIN) 5-325 MG tablet Take 1-2 tablets by mouth every 6 (six) hours as needed. 12 tablet 0  . LISINOPRIL PO Take 0.5 tablets by mouth daily.    Marland Kitchen lisinopril-hydrochlorothiazide (PRINZIDE,ZESTORETIC) 10-12.5 MG tablet Take 1 tablet by mouth daily.    Marland Kitchen METOPROLOL SUCCINATE ER PO Take 37.5 mg by mouth 2 (two) times daily.     . Omega-3 Fatty Acids (FISH OIL PO) Take 2 capsules by mouth 2 (two) times daily.    Marland Kitchen omeprazole (PRILOSEC) 20 MG capsule Take 20 mg by mouth daily.    . predniSONE (DELTASONE) 20 MG tablet Take 2 tablets (40 mg total) by mouth daily. 10 tablet 0  . Vitamin D, Cholecalciferol, 1000 units CAPS Take by mouth.    . Warfarin Sodium (COUMADIN PO) Take 3 mg by mouth daily.      No current facility-administered medications on file prior to  visit.     Past Surgical History:  Procedure Laterality Date  . CARDIAC VALVE REPLACEMENT    . PILONIDAL CYST DRAINAGE    . SHOULDER ARTHROSCOPY W/ BANKART PROCEDURE    . TONSILLECTOMY      No Known Allergies  Social History   Socioeconomic History  . Marital status: Married    Spouse name: Not on file  . Number of children: Not on file  . Years of education: Not on file  . Highest education level: Not on file  Occupational History  . Not on file  Social Needs  . Financial resource strain: Not on file  . Food insecurity:    Worry: Not on file    Inability: Not on file  . Transportation needs:    Medical: Not on file    Non-medical: Not on file  Tobacco Use  . Smoking status: Former Games developer  . Smokeless tobacco: Never Used  Substance and Sexual Activity  . Alcohol use: Yes    Comment: 2 beers/ day  . Drug use: No  . Sexual activity: Not on file  Lifestyle  . Physical activity:    Days per week: Not on file    Minutes per session: Not on file  . Stress: Not on file  Relationships  . Social  connections:    Talks on phone: Not on file    Gets together: Not on file    Attends religious service: Not on file    Active member of club or organization: Not on file    Attends meetings of clubs or organizations: Not on file    Relationship status: Not on file  . Intimate partner violence:    Fear of current or ex partner: Not on file    Emotionally abused: Not on file    Physically abused: Not on file    Forced sexual activity: Not on file  Other Topics Concern  . Not on file  Social History Narrative  . Not on file    History reviewed. No pertinent family history.  BP (!) 144/109   Pulse 67   Ht 5\' 9"  (1.753 m)   Wt 246 lb (111.6 kg)   BMI 36.33 kg/m   Review of Systems: See HPI above.     Objective:  Physical Exam:  Gen: NAD, comfortable in exam room  Right foot/ankle: No gross deformity, swelling, ecchymoses. FROM with 5/5 strength. TTP  minimally insertion of achilles. Negative ant drawer and talar tilt.   Negative calcaneal squeeze. Negative syndesmotic compression. Thompsons test negative. NV intact distally.  Left foot/ankle: No deformity. FROM with 5/5 strength. No tenderness to palpation. NVI distally.   Assessment & Plan:  1. Right achilles strain - clinically much improved.  Shown home exercises to do daily.  Voltaren gel, icing if needed.  Heel lifts.  Avoid flat shoes, inclines.  F/u in 1 month or prn.

## 2019-10-19 DIAGNOSIS — M9903 Segmental and somatic dysfunction of lumbar region: Secondary | ICD-10-CM | POA: Diagnosis not present

## 2019-10-19 DIAGNOSIS — M9901 Segmental and somatic dysfunction of cervical region: Secondary | ICD-10-CM | POA: Diagnosis not present

## 2019-10-19 DIAGNOSIS — M9902 Segmental and somatic dysfunction of thoracic region: Secondary | ICD-10-CM | POA: Diagnosis not present

## 2019-10-19 DIAGNOSIS — M5415 Radiculopathy, thoracolumbar region: Secondary | ICD-10-CM | POA: Diagnosis not present

## 2019-10-19 DIAGNOSIS — M5137 Other intervertebral disc degeneration, lumbosacral region: Secondary | ICD-10-CM | POA: Diagnosis not present

## 2019-10-26 DIAGNOSIS — M5137 Other intervertebral disc degeneration, lumbosacral region: Secondary | ICD-10-CM | POA: Diagnosis not present

## 2019-10-26 DIAGNOSIS — M9901 Segmental and somatic dysfunction of cervical region: Secondary | ICD-10-CM | POA: Diagnosis not present

## 2019-10-26 DIAGNOSIS — M9902 Segmental and somatic dysfunction of thoracic region: Secondary | ICD-10-CM | POA: Diagnosis not present

## 2019-10-26 DIAGNOSIS — M9903 Segmental and somatic dysfunction of lumbar region: Secondary | ICD-10-CM | POA: Diagnosis not present

## 2019-10-26 DIAGNOSIS — M5415 Radiculopathy, thoracolumbar region: Secondary | ICD-10-CM | POA: Diagnosis not present

## 2019-11-09 DIAGNOSIS — M5415 Radiculopathy, thoracolumbar region: Secondary | ICD-10-CM | POA: Diagnosis not present

## 2019-11-09 DIAGNOSIS — M9901 Segmental and somatic dysfunction of cervical region: Secondary | ICD-10-CM | POA: Diagnosis not present

## 2019-11-09 DIAGNOSIS — M9903 Segmental and somatic dysfunction of lumbar region: Secondary | ICD-10-CM | POA: Diagnosis not present

## 2019-11-09 DIAGNOSIS — M9902 Segmental and somatic dysfunction of thoracic region: Secondary | ICD-10-CM | POA: Diagnosis not present

## 2019-11-09 DIAGNOSIS — M5137 Other intervertebral disc degeneration, lumbosacral region: Secondary | ICD-10-CM | POA: Diagnosis not present

## 2019-11-16 DIAGNOSIS — M9902 Segmental and somatic dysfunction of thoracic region: Secondary | ICD-10-CM | POA: Diagnosis not present

## 2019-11-16 DIAGNOSIS — M5415 Radiculopathy, thoracolumbar region: Secondary | ICD-10-CM | POA: Diagnosis not present

## 2019-11-16 DIAGNOSIS — M5137 Other intervertebral disc degeneration, lumbosacral region: Secondary | ICD-10-CM | POA: Diagnosis not present

## 2019-11-16 DIAGNOSIS — M9901 Segmental and somatic dysfunction of cervical region: Secondary | ICD-10-CM | POA: Diagnosis not present

## 2019-11-16 DIAGNOSIS — M9903 Segmental and somatic dysfunction of lumbar region: Secondary | ICD-10-CM | POA: Diagnosis not present

## 2019-12-07 DIAGNOSIS — M5137 Other intervertebral disc degeneration, lumbosacral region: Secondary | ICD-10-CM | POA: Diagnosis not present

## 2019-12-07 DIAGNOSIS — M9903 Segmental and somatic dysfunction of lumbar region: Secondary | ICD-10-CM | POA: Diagnosis not present

## 2019-12-07 DIAGNOSIS — M5415 Radiculopathy, thoracolumbar region: Secondary | ICD-10-CM | POA: Diagnosis not present

## 2019-12-07 DIAGNOSIS — M9901 Segmental and somatic dysfunction of cervical region: Secondary | ICD-10-CM | POA: Diagnosis not present

## 2019-12-07 DIAGNOSIS — M9902 Segmental and somatic dysfunction of thoracic region: Secondary | ICD-10-CM | POA: Diagnosis not present

## 2019-12-21 DIAGNOSIS — M9903 Segmental and somatic dysfunction of lumbar region: Secondary | ICD-10-CM | POA: Diagnosis not present

## 2019-12-21 DIAGNOSIS — M5415 Radiculopathy, thoracolumbar region: Secondary | ICD-10-CM | POA: Diagnosis not present

## 2019-12-21 DIAGNOSIS — M9902 Segmental and somatic dysfunction of thoracic region: Secondary | ICD-10-CM | POA: Diagnosis not present

## 2019-12-21 DIAGNOSIS — M9901 Segmental and somatic dysfunction of cervical region: Secondary | ICD-10-CM | POA: Diagnosis not present

## 2019-12-21 DIAGNOSIS — M5137 Other intervertebral disc degeneration, lumbosacral region: Secondary | ICD-10-CM | POA: Diagnosis not present

## 2020-01-10 DIAGNOSIS — M5137 Other intervertebral disc degeneration, lumbosacral region: Secondary | ICD-10-CM | POA: Diagnosis not present

## 2020-01-10 DIAGNOSIS — M9901 Segmental and somatic dysfunction of cervical region: Secondary | ICD-10-CM | POA: Diagnosis not present

## 2020-01-10 DIAGNOSIS — M9903 Segmental and somatic dysfunction of lumbar region: Secondary | ICD-10-CM | POA: Diagnosis not present

## 2020-01-10 DIAGNOSIS — M5415 Radiculopathy, thoracolumbar region: Secondary | ICD-10-CM | POA: Diagnosis not present

## 2020-01-10 DIAGNOSIS — M9902 Segmental and somatic dysfunction of thoracic region: Secondary | ICD-10-CM | POA: Diagnosis not present

## 2020-02-08 DIAGNOSIS — M9903 Segmental and somatic dysfunction of lumbar region: Secondary | ICD-10-CM | POA: Diagnosis not present

## 2020-02-08 DIAGNOSIS — M9901 Segmental and somatic dysfunction of cervical region: Secondary | ICD-10-CM | POA: Diagnosis not present

## 2020-02-08 DIAGNOSIS — M5415 Radiculopathy, thoracolumbar region: Secondary | ICD-10-CM | POA: Diagnosis not present

## 2020-02-08 DIAGNOSIS — M9902 Segmental and somatic dysfunction of thoracic region: Secondary | ICD-10-CM | POA: Diagnosis not present

## 2020-02-08 DIAGNOSIS — M5137 Other intervertebral disc degeneration, lumbosacral region: Secondary | ICD-10-CM | POA: Diagnosis not present

## 2020-03-21 DIAGNOSIS — M9902 Segmental and somatic dysfunction of thoracic region: Secondary | ICD-10-CM | POA: Diagnosis not present

## 2020-03-21 DIAGNOSIS — M9901 Segmental and somatic dysfunction of cervical region: Secondary | ICD-10-CM | POA: Diagnosis not present

## 2020-03-21 DIAGNOSIS — M5415 Radiculopathy, thoracolumbar region: Secondary | ICD-10-CM | POA: Diagnosis not present

## 2020-03-21 DIAGNOSIS — M9903 Segmental and somatic dysfunction of lumbar region: Secondary | ICD-10-CM | POA: Diagnosis not present

## 2020-03-21 DIAGNOSIS — M5137 Other intervertebral disc degeneration, lumbosacral region: Secondary | ICD-10-CM | POA: Diagnosis not present

## 2020-06-21 ENCOUNTER — Emergency Department (HOSPITAL_BASED_OUTPATIENT_CLINIC_OR_DEPARTMENT_OTHER): Payer: No Typology Code available for payment source

## 2020-06-21 ENCOUNTER — Encounter (HOSPITAL_BASED_OUTPATIENT_CLINIC_OR_DEPARTMENT_OTHER): Payer: Self-pay | Admitting: *Deleted

## 2020-06-21 ENCOUNTER — Emergency Department (HOSPITAL_BASED_OUTPATIENT_CLINIC_OR_DEPARTMENT_OTHER)
Admission: EM | Admit: 2020-06-21 | Discharge: 2020-06-22 | Disposition: A | Discharge status: Home / Self Care | Payer: No Typology Code available for payment source | Attending: Emergency Medicine | Admitting: Emergency Medicine

## 2020-06-21 ENCOUNTER — Other Ambulatory Visit: Payer: Self-pay

## 2020-06-21 DIAGNOSIS — R0902 Hypoxemia: Secondary | ICD-10-CM | POA: Diagnosis not present

## 2020-06-21 DIAGNOSIS — R109 Unspecified abdominal pain: Secondary | ICD-10-CM

## 2020-06-21 DIAGNOSIS — Z79899 Other long term (current) drug therapy: Secondary | ICD-10-CM | POA: Diagnosis not present

## 2020-06-21 DIAGNOSIS — R6889 Other general symptoms and signs: Secondary | ICD-10-CM | POA: Diagnosis not present

## 2020-06-21 DIAGNOSIS — R140 Abdominal distension (gaseous): Secondary | ICD-10-CM | POA: Diagnosis not present

## 2020-06-21 DIAGNOSIS — I509 Heart failure, unspecified: Secondary | ICD-10-CM | POA: Diagnosis not present

## 2020-06-21 DIAGNOSIS — Z7901 Long term (current) use of anticoagulants: Secondary | ICD-10-CM | POA: Insufficient documentation

## 2020-06-21 DIAGNOSIS — I130 Hypertensive heart and chronic kidney disease with heart failure and stage 1 through stage 4 chronic kidney disease, or unspecified chronic kidney disease: Secondary | ICD-10-CM | POA: Insufficient documentation

## 2020-06-21 DIAGNOSIS — Z7982 Long term (current) use of aspirin: Secondary | ICD-10-CM | POA: Insufficient documentation

## 2020-06-21 DIAGNOSIS — Z743 Need for continuous supervision: Secondary | ICD-10-CM | POA: Diagnosis not present

## 2020-06-21 DIAGNOSIS — K219 Gastro-esophageal reflux disease without esophagitis: Secondary | ICD-10-CM | POA: Diagnosis not present

## 2020-06-21 DIAGNOSIS — Z87891 Personal history of nicotine dependence: Secondary | ICD-10-CM | POA: Diagnosis not present

## 2020-06-21 DIAGNOSIS — R339 Retention of urine, unspecified: Secondary | ICD-10-CM | POA: Insufficient documentation

## 2020-06-21 DIAGNOSIS — N189 Chronic kidney disease, unspecified: Secondary | ICD-10-CM | POA: Diagnosis not present

## 2020-06-21 DIAGNOSIS — K5900 Constipation, unspecified: Secondary | ICD-10-CM | POA: Diagnosis not present

## 2020-06-21 HISTORY — DX: Anxiety disorder, unspecified: F41.9

## 2020-06-21 HISTORY — DX: Gastro-esophageal reflux disease without esophagitis: K21.9

## 2020-06-21 HISTORY — DX: Unspecified osteoarthritis, unspecified site: M19.90

## 2020-06-21 HISTORY — DX: Gout, unspecified: M10.9

## 2020-06-21 HISTORY — DX: Unspecified atrial fibrillation: I48.91

## 2020-06-21 HISTORY — DX: Chronic kidney disease, unspecified: N18.9

## 2020-06-21 LAB — URINALYSIS, ROUTINE W REFLEX MICROSCOPIC
Bilirubin Urine: NEGATIVE
Glucose, UA: NEGATIVE mg/dL
Hgb urine dipstick: NEGATIVE
Ketones, ur: NEGATIVE mg/dL
Leukocytes,Ua: NEGATIVE
Nitrite: NEGATIVE
Protein, ur: NEGATIVE mg/dL
Specific Gravity, Urine: 1.02 (ref 1.005–1.030)
pH: 6 (ref 5.0–8.0)

## 2020-06-21 NOTE — ED Provider Notes (Signed)
[facility] EMERGENCY DEPARTMENT Provider Note  CSN: [id number] Arrival date & time: 06/21/20 2258  Chief Complaint(s) Abdominal Pain  HPI Nathan Romero. is a 74 y.o. male  CC: urinary retention  Onset/Duration: several days Timing: intermittent, worse since yesterday Quality: unable to completely empty bladder, now not able to void Severity: moderate Modifying Factors:  Improved by: nothing  Worsened by: nothing Associated Signs/Symptoms:  Pertinent (+): constipation for several weeks, lower abd discomfort  Pertinent (-): N/V, fever, chills,   Reports having right eye vitrectomy on Monday. Started having sx afterward.  The history is provided by the patient.    Past Medical History Past Medical History:  Diagnosis Date  . Anxiety   . Arthritis   . Atrial fibrillation (HCC)   . CHF (congestive heart failure) (HCC)   . Chronic kidney disease   . GERD (gastroesophageal reflux disease)   . Gout   . Hypertension    Patient Active Problem List   Diagnosis Date Noted  . Strain of right Achilles tendon 11/11/2017   Home Medication(s) Prior to Admission medications   Medication Sig Start Date End Date Taking? Authorizing Provider  hydrALAZINE (APRESOLINE) 10 MG tablet Take 10 mg by mouth 3 (three) times daily. 2 tabs   Yes [provider]  Magnesium Oxide 420 (252 Mg) MG TABS Take by mouth.   Yes [provider]  aspirin 81 MG tablet Take 81 mg by mouth daily.    [provider]  ATORVASTATIN CALCIUM PO Take 40 mg by mouth daily.     [provider]  chlorthalidone (HYGROTON) 25 MG tablet Take by mouth.    [provider]  citalopram (CELEXA) 20 MG tablet Take by mouth.    [provider]  citalopram (CELEXA) 40 MG tablet Take 20 mg by mouth daily.     [provider]  HYDROcodone-acetaminophen (NORCO/VICODIN) 5-325 MG tablet Take 1-2 tablets by mouth every 6 (six) hours as needed. 12/18/15    Roxy Horseman, PA-C  LISINOPRIL PO Take 0.5 tablets by mouth daily.    [provider]  Omega-3 Fatty Acids (FISH OIL PO) Take 2 capsules by mouth 2 (two) times daily.    [provider]  omeprazole (PRILOSEC) 20 MG capsule Take 20 mg by mouth daily.    [provider]  polyethylene glycol powder (MIRALAX) 17 GM/SCOOP powder Please take 6 capfuls of MiraLAX in a 32 oz bottle of Gatorade over 2-4 hour period. The following day take 3 capfuls. On day 3 start taking 1 capful 3 times a day. Slowly cut back as needed until you have normal bowel movements. 06/22/20   Nira Conn, MD  Vitamin D, Cholecalciferol, 1000 units CAPS Take by mouth.    [provider]  Warfarin Sodium (COUMADIN PO) Take 3 mg by mouth daily.     [provider]  Past Surgical History Past Surgical History:  Procedure Laterality Date  . AORTIC VALVE REPLACEMENT    . CARDIAC VALVE REPLACEMENT    . PARS PLANA VITRECTOMY    . PILONIDAL CYST DRAINAGE    . SHOULDER ARTHROSCOPY W/ BANKART PROCEDURE    . TONSILLECTOMY     Family History No family history on file.  Social History Social History   Tobacco Use  . Smoking status: Former Games developer  . Smokeless tobacco: Never Used  Substance Use Topics  . Alcohol use: Yes    Comment: 2 beers/ day  . Drug use: No   Allergies Patient has no known allergies.  Review of Systems Review of Systems All other systems are reviewed and are negative for acute change except as noted in the HPI  Physical Exam Vital Signs  I have reviewed the triage vital signs BP (!) 150/87   Pulse 72   Temp 98.6 F (37 C) (Oral)   Ht 5\' 9"  (1.753 m)   Wt 102.5 kg   SpO2 97%   BMI 33.37 kg/m   Physical Exam Vitals reviewed.  Constitutional:      General: He is not in acute distress.    Appearance: He is  well-developed. He is not diaphoretic.  HENT:     Head: Normocephalic and atraumatic.     Nose: Nose normal.  Eyes:     General: No scleral icterus.       Right eye: No discharge.        Left eye: No discharge.     Conjunctiva/sclera:     Right eye: Right conjunctiva is injected. Hemorrhage present.     Pupils: Pupils are equal, round, and reactive to light.  Cardiovascular:     Rate and Rhythm: Normal rate and regular rhythm.     Heart sounds: No murmur heard.  No friction rub. No gallop.   Pulmonary:     Effort: Pulmonary effort is normal. No respiratory distress.     Breath sounds: Normal breath sounds. No stridor. No rales.  Abdominal:     General: There is no distension.     Palpations: Abdomen is soft.     Tenderness: There is abdominal tenderness in the suprapubic area.  Musculoskeletal:        General: No tenderness.     Cervical back: Normal range of motion and neck supple.  Skin:    General: Skin is warm and dry.     Findings: No erythema or rash.  Neurological:     Mental Status: He is alert and oriented to person, place, and time.     ED Results and Treatments Labs (all labs ordered are listed, but only abnormal results are displayed) Labs Reviewed  CBC WITH DIFFERENTIAL/PLATELET - Abnormal; Notable for the following components:      Result Value   WBC 12.9 (*)    Neutro Abs 11.4 (*)    Lymphs Abs 0.6 (*)    All other components within normal limits  COMPREHENSIVE METABOLIC PANEL - Abnormal; Notable for the following components:   Chloride 97 (*)    Glucose, Bld 110 (*)    BUN 32 (*)    Creatinine, Ser 1.37 (*)    AST 13 (*)    GFR, Estimated 54 (*)    All other components within normal limits  URINALYSIS, ROUTINE W REFLEX MICROSCOPIC  EKG  EKG Interpretation  Date/Time:    Ventricular Rate:    PR Interval:    QRS Duration:   QT  Interval:    QTC Calculation:   R Axis:     Text Interpretation:        Radiology DG ABD ACUTE 2+V W 1V CHEST  Result Date: 06/22/2020 CLINICAL DATA:  Constipation EXAM: DG ABDOMEN ACUTE WITH 1 VIEW CHEST COMPARISON:  None. FINDINGS: There is an above average amount of stool throughout the colon. The bowel gas pattern is nonobstructive. There are multiple rounded densities projecting over the left upper quadrant of unknown clinical significance. These may represent splenic granulomas. The heart size is enlarged. The patient is status post prior median sternotomy. There are calcified mediastinal and hilar lymph nodes. There is no pneumothorax. No large pleural effusion. The patient is status post prior CABG. Aortic calcifications are noted. IMPRESSION: 1. Above average amount of stool throughout the colon. 2. Evidence of prior granulomatous disease. 3. Cardiomegaly. 4. Nonobstructed bowel gas pattern. Electronically Signed   By: Katherine Mantlehristopher  Green M.D.   On: 06/22/2020 01:03    Pertinent labs & imaging results that were available during my care of the patient were reviewed by me and considered in my medical decision making (see chart for details).  Medications Ordered in ED Medications - No data to display                                                                                                                                  Procedures Procedures  (including critical care time)  Medical Decision Making / ED Course I have reviewed the nursing notes for this encounter and the patient's prior records (if available in EHR or on provided paperwork).   Abb Jomarie LongsJoseph Beryle Flockrotta Jr. was evaluated in Emergency Department on 06/22/2020 for the symptoms described in the history of present illness. He was evaluated in the context of the global COVID-19 pandemic, which necessitated consideration that the patient might be at risk for infection with the SARS-CoV-2 virus that causes COVID-19. Institutional  protocols and algorithms that pertain to the evaluation of patients at risk for COVID-19 are in a state of rapid change based on information released by regulatory bodies including the CDC and federal and state organizations. These policies and algorithms were followed during the patient's care in the ED.  Noted to have >500cc in bladder. Confirmed with foley placement. Patient has suprapubic abdominal discomfort which resolved after Foley was placed. Labs with mild leukocytosis no anemia.  No significant electrolyte derangement.  Patient's renal function is close to his baseline. UA without evidence of infection. Acute abdominal series notable for moderate to large stool burden.  No evidence of bowel obstruction.  Recommend urology follow-up for retention, which may be secondary to anesthesia versus constipation.  Bowel regimen provided.      Final Clinical Impression(s) / ED Diagnoses Final diagnoses:  Abdominal pain  Urinary retention  Constipation, unspecified constipation type    The patient appears reasonably screened and/or stabilized for discharge and I doubt any other medical condition or other [facility] requiring further screening, evaluation, or treatment in the ED at this time prior to discharge. Safe for discharge with strict return precautions.  Disposition: Discharge  Condition: Good  I have discussed the results, Dx and Tx plan with the patient/family who expressed understanding and agree(s) with the plan. Discharge instructions discussed at length. The patient/family was given strict return precautions who verbalized understanding of the instructions. No further questions at time of discharge.    ED Discharge Orders         Ordered    polyethylene glycol powder (MIRALAX) 17 GM/SCOOP powder        06/22/20 0113          Follow Up: Patience Musca, MD [address] [place] [address] [phone]  Call  To schedule an appointment for close follow  up regarding your urinary retention.     This chart was dictated using voice recognition software.  Despite best efforts to proofread,  errors can occur which can change the documentation meaning.   Nira Conn, MD 06/22/20 [phone]

## 2020-06-21 NOTE — ED Triage Notes (Addendum)
Pt arrived by [facility] with c/o constipation. States he had an eye surgery at [facility] on Monday and had some issues with not being able to urinate. States those symptoms improved, but he has become constipated. Pt states he took a ducolax suppository tonight around 2100 with no results. Also, concerned he has not been able to urinate since this morning. C/o nausea at 2200 but denies any present. States pain comes and goes. Denies any present.  Pt states he does not have a ride home.

## 2020-06-22 LAB — CBC WITH DIFFERENTIAL/PLATELET
Abs Immature Granulocytes: 0.05 10*3/uL (ref 0.00–0.07)
Basophils Absolute: 0.1 10*3/uL (ref 0.0–0.1)
Basophils Relative: 1 %
Eosinophils Absolute: 0.1 10*3/uL (ref 0.0–0.5)
Eosinophils Relative: 1 %
HCT: 42.7 % (ref 39.0–52.0)
Hemoglobin: 14.4 g/dL (ref 13.0–17.0)
Immature Granulocytes: 0 %
Lymphocytes Relative: 5 %
Lymphs Abs: 0.6 10*3/uL — ABNORMAL LOW (ref 0.7–4.0)
MCH: 33.3 pg (ref 26.0–34.0)
MCHC: 33.7 g/dL (ref 30.0–36.0)
MCV: 98.6 fL (ref 80.0–100.0)
Monocytes Absolute: 0.7 10*3/uL (ref 0.1–1.0)
Monocytes Relative: 5 %
Neutro Abs: 11.4 10*3/uL — ABNORMAL HIGH (ref 1.7–7.7)
Neutrophils Relative %: 88 %
Platelets: 212 10*3/uL (ref 150–400)
RBC: 4.33 MIL/uL (ref 4.22–5.81)
RDW: 13.2 % (ref 11.5–15.5)
WBC: 12.9 10*3/uL — ABNORMAL HIGH (ref 4.0–10.5)
nRBC: 0 % (ref 0.0–0.2)

## 2020-06-22 LAB — COMPREHENSIVE METABOLIC PANEL
ALT: 18 U/L (ref 0–44)
AST: 13 U/L — ABNORMAL LOW (ref 15–41)
Albumin: 3.9 g/dL (ref 3.5–5.0)
Alkaline Phosphatase: 59 U/L (ref 38–126)
Anion gap: 14 (ref 5–15)
BUN: 32 mg/dL — ABNORMAL HIGH (ref 8–23)
CO2: 24 mmol/L (ref 22–32)
Calcium: 9.1 mg/dL (ref 8.9–10.3)
Chloride: 97 mmol/L — ABNORMAL LOW (ref 98–111)
Creatinine, Ser: 1.37 mg/dL — ABNORMAL HIGH (ref 0.61–1.24)
GFR, Estimated: 54 mL/min — ABNORMAL LOW (ref >60)
Glucose, Bld: 110 mg/dL — ABNORMAL HIGH (ref 70–99)
Potassium: 3.7 mmol/L (ref 3.5–5.1)
Sodium: 135 mmol/L (ref 135–145)
Total Bilirubin: 1 mg/dL (ref 0.3–1.2)
Total Protein: 7.1 g/dL (ref 6.5–8.1)

## 2020-06-22 MED ORDER — POLYETHYLENE GLYCOL 3350 17 GM/SCOOP PO POWD: ORAL | 0 refills | Status: AC

## 2020-06-22 NOTE — ED Notes (Signed)
Pt has had two smear type stools since arrival to ED. Reports suppository prior to arrival.

## 2020-07-01 DIAGNOSIS — R3912 Poor urinary stream: Secondary | ICD-10-CM | POA: Diagnosis not present

## 2020-07-01 DIAGNOSIS — R338 Other retention of urine: Secondary | ICD-10-CM | POA: Diagnosis not present

## 2020-07-08 DIAGNOSIS — R338 Other retention of urine: Secondary | ICD-10-CM | POA: Diagnosis not present

## 2020-07-08 DIAGNOSIS — R3915 Urgency of urination: Secondary | ICD-10-CM | POA: Diagnosis not present

## 2020-12-29 IMAGING — DX DG ABDOMEN ACUTE W/ 1V CHEST
4 series · 4 of 4 positions shown · non-contrast
Comparison: None.

CLINICAL DATA: Constipation

EXAM:
DG ABDOMEN ACUTE WITH 1 VIEW CHEST

[abdomen supine (1 of 2)]
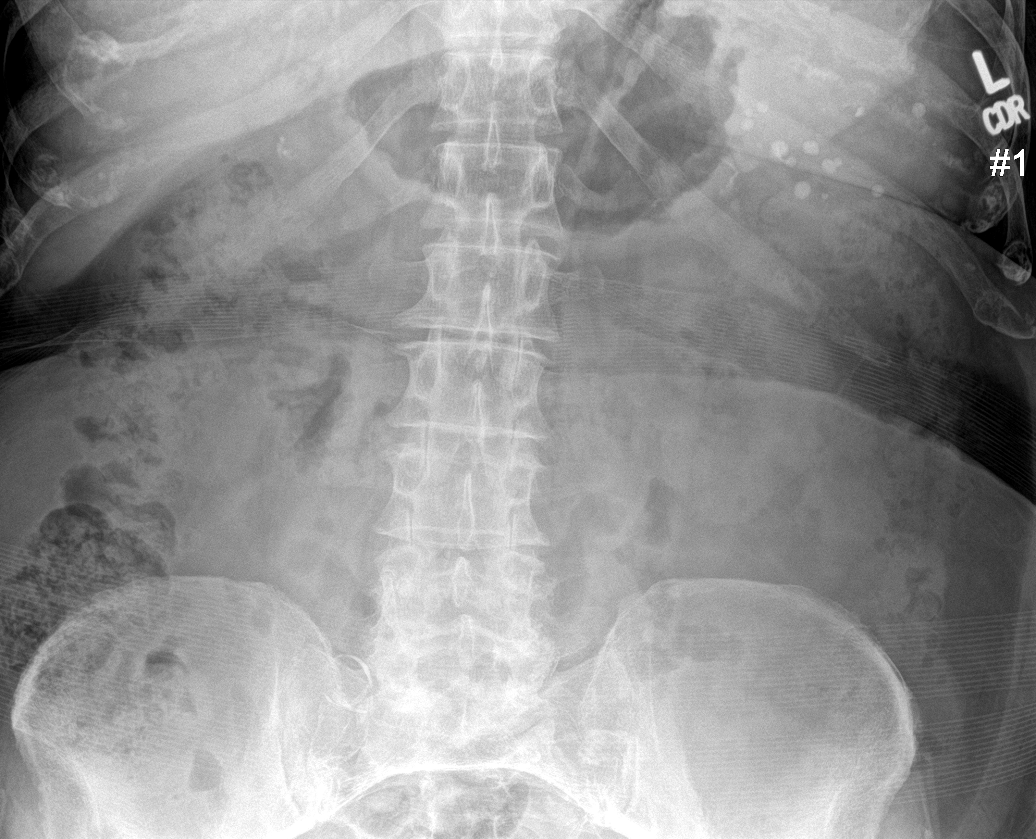

[chest pa]
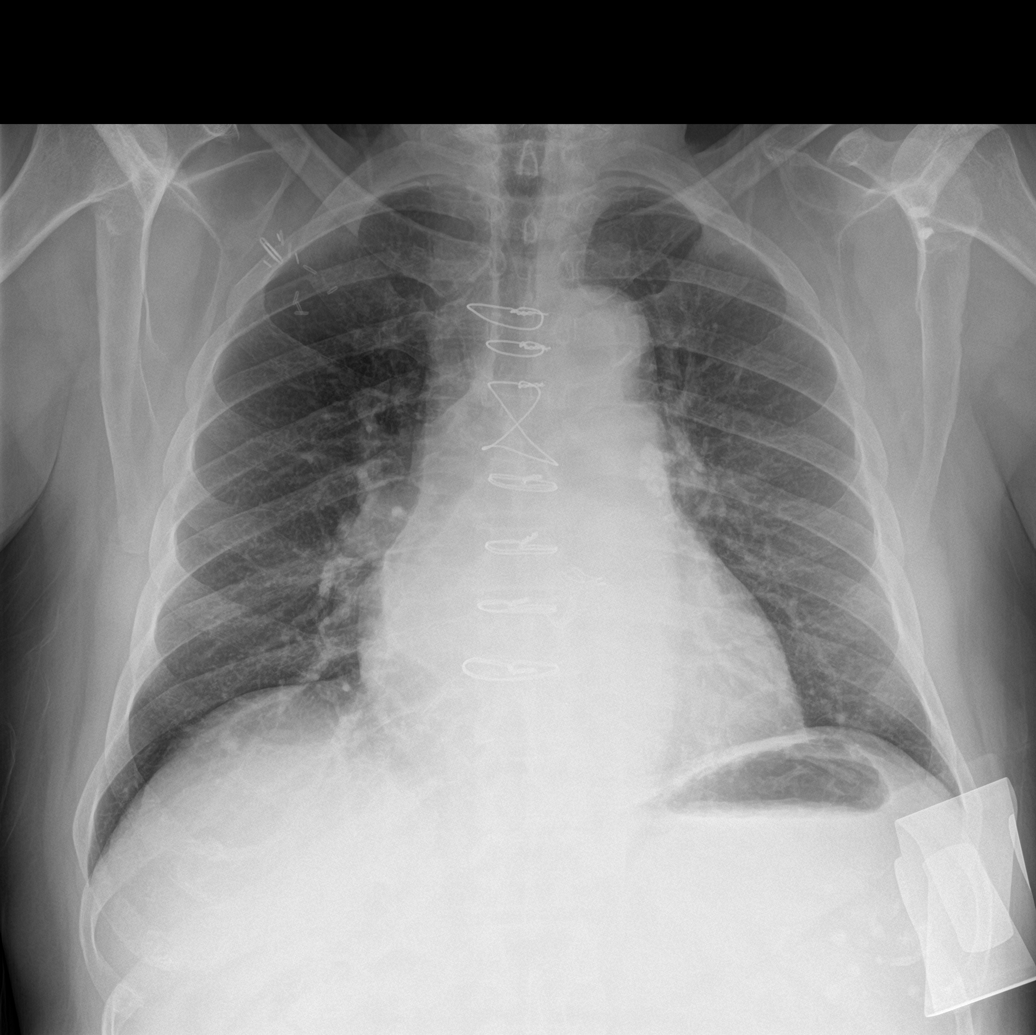

[abdomen erect]
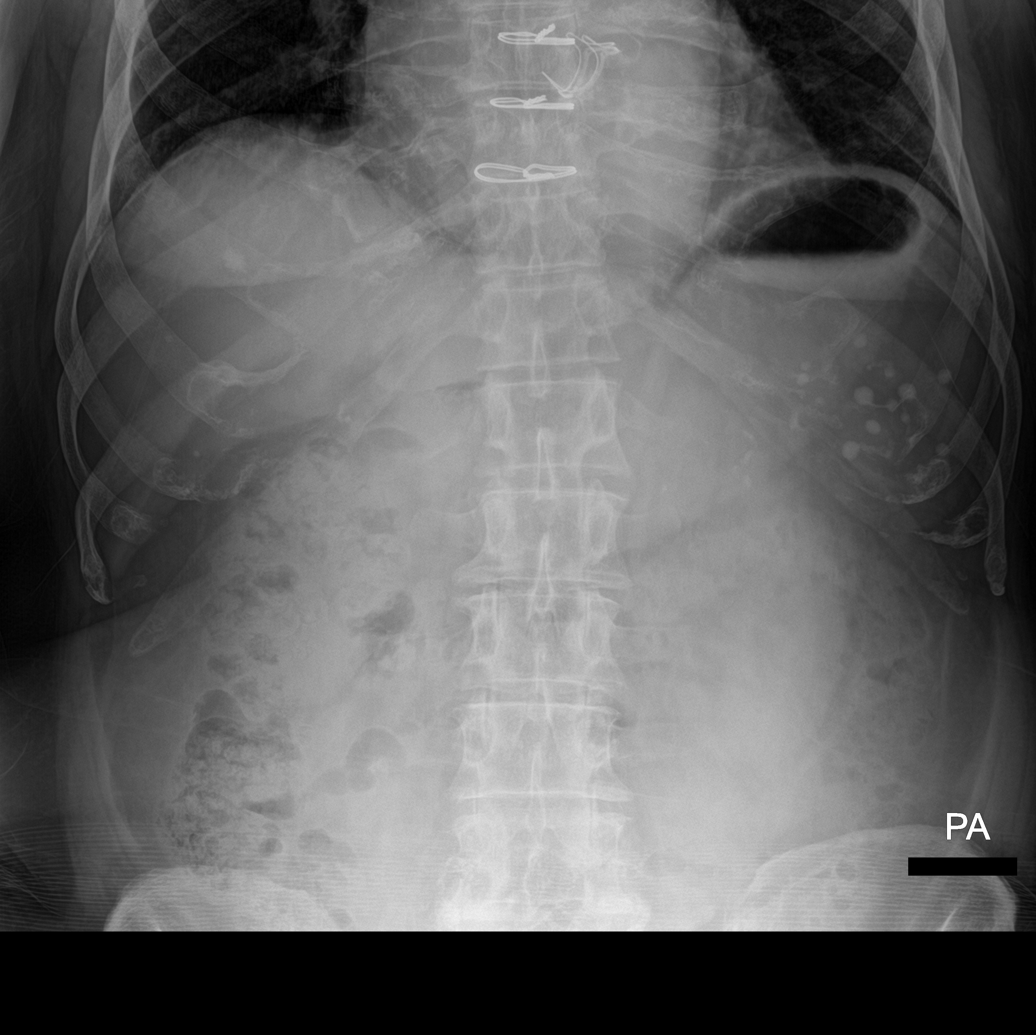

[abdomen supine (2 of 2)]
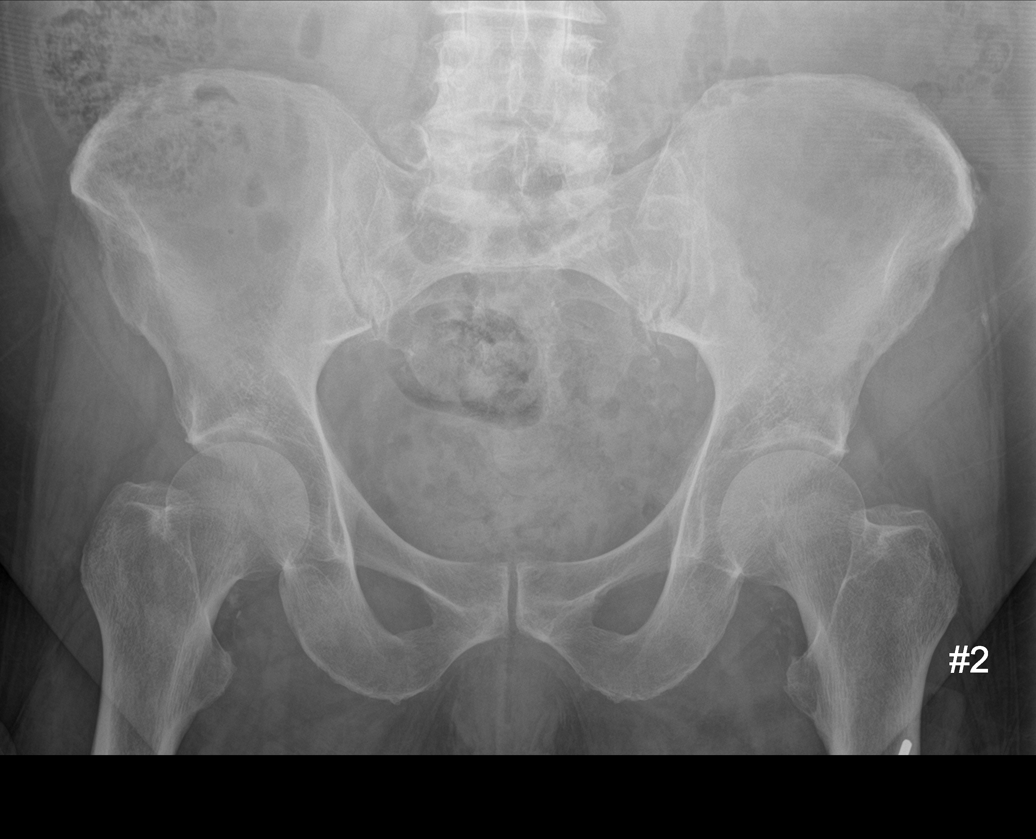

[4 of 4 positions shown; findings below may reference images not displayed]

FINDINGS: There is an above average amount of stool throughout the colon. The
bowel gas pattern is nonobstructive. There are multiple rounded
densities projecting over the left upper quadrant of unknown
clinical significance. These may represent splenic granulomas. The
heart size is enlarged. The patient is status post prior median
sternotomy. There are calcified mediastinal and hilar lymph nodes.
There is no pneumothorax. No large pleural effusion. The patient is
status post prior CABG. Aortic calcifications are noted.
IMPRESSION: 1. Above average amount of stool throughout the colon.
2. Evidence of prior granulomatous disease.
3. Cardiomegaly.
4. Nonobstructed bowel gas pattern.

## 2023-05-02 ENCOUNTER — Emergency Department (HOSPITAL_BASED_OUTPATIENT_CLINIC_OR_DEPARTMENT_OTHER)
Admission: EM | Admit: 2023-05-02 | Discharge: 2023-05-02 | Disposition: A | Discharge status: Home / Self Care | Payer: No Typology Code available for payment source | Attending: Emergency Medicine | Admitting: Emergency Medicine

## 2023-05-02 ENCOUNTER — Encounter (HOSPITAL_BASED_OUTPATIENT_CLINIC_OR_DEPARTMENT_OTHER): Payer: Self-pay | Admitting: Urology

## 2023-05-02 DIAGNOSIS — I509 Heart failure, unspecified: Secondary | ICD-10-CM | POA: Diagnosis not present

## 2023-05-02 DIAGNOSIS — Z7982 Long term (current) use of aspirin: Secondary | ICD-10-CM | POA: Diagnosis not present

## 2023-05-02 DIAGNOSIS — Z7901 Long term (current) use of anticoagulants: Secondary | ICD-10-CM | POA: Insufficient documentation

## 2023-05-02 DIAGNOSIS — N189 Chronic kidney disease, unspecified: Secondary | ICD-10-CM | POA: Insufficient documentation

## 2023-05-02 DIAGNOSIS — R300 Dysuria: Secondary | ICD-10-CM | POA: Diagnosis present

## 2023-05-02 DIAGNOSIS — R350 Frequency of micturition: Secondary | ICD-10-CM | POA: Insufficient documentation

## 2023-05-02 DIAGNOSIS — I130 Hypertensive heart and chronic kidney disease with heart failure and stage 1 through stage 4 chronic kidney disease, or unspecified chronic kidney disease: Secondary | ICD-10-CM | POA: Diagnosis not present

## 2023-05-02 DIAGNOSIS — Z79899 Other long term (current) drug therapy: Secondary | ICD-10-CM | POA: Insufficient documentation

## 2023-05-02 LAB — URINALYSIS, ROUTINE W REFLEX MICROSCOPIC
Glucose, UA: NEGATIVE mg/dL
Hgb urine dipstick: NEGATIVE
Ketones, ur: NEGATIVE mg/dL
Leukocytes,Ua: NEGATIVE
Nitrite: NEGATIVE
Protein, ur: NEGATIVE mg/dL
Specific Gravity, Urine: 1.025 (ref 1.005–1.030)
pH: 6 (ref 5.0–8.0)

## 2023-05-02 NOTE — ED Notes (Signed)
Reviewed discharge instructions with pt. Pt states understanding [facility]

## 2023-05-02 NOTE — ED Provider Notes (Signed)
Nathan Romero EMERGENCY DEPARTMENT AT [facility] Provider Note   CSN: [id number] Arrival date & time: 05/02/23  1118     History  Chief Complaint  Patient presents with   Dysuria    Nathan Romero. is a 77 y.o. male w/ pmhx of CHF, GERD, Gout, HTN, afib, CKD presenting w/ dysuria.  Patient reports that he had a conversation over secure chat with primary care who encouraged him to come to the emergency room to rule out urinary tract infection.  States he had symptoms that started on Friday -worsening urinary frequency x 3 episodes.  Patient reports associated symptom of hesitency, weak stream which has been ongoing for years. Does not have known BPH. Denies dysuria, denies blood in the urine, denies suprapubic pain. Patient denies further workup.   Dysuria Presenting symptoms: dysuria   Associated symptoms: no flank pain and no genital itching        Home Medications Prior to Admission medications   Medication Sig Start Date End Date Taking? Authorizing Provider  aspirin 81 MG tablet Take 81 mg by mouth daily.    [provider]  ATORVASTATIN CALCIUM PO Take 40 mg by mouth daily.     [provider]  chlorthalidone (HYGROTON) 25 MG tablet Take by mouth.    [provider]  citalopram (CELEXA) 20 MG tablet Take by mouth.    [provider]  citalopram (CELEXA) 40 MG tablet Take 20 mg by mouth daily.     [provider]  hydrALAZINE (APRESOLINE) 10 MG tablet Take 10 mg by mouth 3 (three) times daily. 2 tabs    [provider]  HYDROcodone-acetaminophen (NORCO/VICODIN) 5-325 MG tablet Take 1-2 tablets by mouth every 6 (six) hours as needed. 12/18/15   Roxy Horseman, PA-C  LISINOPRIL PO Take 0.5 tablets by mouth daily.    [provider]  Magnesium Oxide 420 (252 Mg) MG TABS Take by mouth.    [provider]  Omega-3 Fatty Acids (FISH OIL PO) Take 2 capsules by mouth 2 (two) times daily.    [provider]  omeprazole (PRILOSEC) 20 MG capsule Take 20 mg by mouth daily.    [provider]  polyethylene glycol powder (MIRALAX) 17 GM/SCOOP powder Please take 6 capfuls of MiraLAX in a 32 oz bottle of Gatorade over 2-4 hour period. The following day take 3 capfuls. On day 3 start taking 1 capful 3 times a day. Slowly cut back as needed until you have normal bowel movements. 06/22/20   Nira Conn, MD  Vitamin D, Cholecalciferol, 1000 units CAPS Take by mouth.    [provider]  Warfarin Sodium (COUMADIN PO) Take 3 mg by mouth daily.     [provider]      Allergies    Patient has no known allergies.    Review of Systems   Review of Systems  Genitourinary:  Positive for dysuria. Negative for flank pain.    Physical Exam Updated Vital Signs BP 102/68 (BP Location: Left Arm)   Pulse 79   Temp (!) 97 F (36.1 C) (Oral)   Resp 18   Ht 5\' 9"  (1.753 m)   Wt 102.5 kg   SpO2 97%   BMI 33.37 kg/m  Physical Exam  ED Results / Procedures / Treatments   Labs (all labs ordered are listed, but only abnormal results are displayed) Labs Reviewed  URINALYSIS, ROUTINE W REFLEX MICROSCOPIC - Abnormal; Notable for the following components:  Result Value   Bilirubin Urine SMALL (*)    All other components within normal limits    EKG None  Radiology No results found.  Procedures Procedures    Medications Ordered in ED Medications - No data to display  ED Course/ Medical Decision Making/ A&P                                 Medical Decision Making Amount and/or Complexity of Data Reviewed Labs: ordered.   Nathan Romero. 77 y.o. presented today for dysuria. Working Ddx: includes, but not limited to, gastroenteritis, colitis, appendicitis, pancreatitis, nephrolithiasis, UTI, pyelonephritis,dehydration, electrolyte abnormalities, bph  R/o DDx: These are considered less likely than current impression due to history of present  illness, physical exam, labs/imaging findings.  Review of prior external notes: conversation with PCP today   Pmhx: CHF, GERD, Gout, HTN, afib, CKD  Unique Tests and My Interpretation:  None  Imaging:  None  Problem List / ED Course / Critical interventions / Medication management  Pt presenitng to the emergency room with urinary frequency that occurred 3 separate times.  Patient has had ongoing hesitancy of stream but feels like he is fully emptying his bladder.  Denies any fever chills or abdominal pain.  Does not have any known history of BPH, but findings are consistent with this.  I offered the workup as well as urology referral.  Patient feels more comfortable going through primary care at the [facility].  He has no symptoms at this time. Denies recent trauma/injury. normal UA - no WBC or RBC, I will send patient home to f/u w/ PCP.  none Reevaluation of the patient after these medicines showed that the patient improved Patients vitals assessed. Upon arrival patient is hemodynamically stable.  I have reviewed the patients home medicines and have made adjustments as needed   Consult: None   Plan: Offered urology referral, patient declined, would like to go through PCP F/u w/ PCP in 2-3d to ensure resolution of sx.  Patient was given return precautions. Patient stable for discharge at this time.  Patient educated on current sx/dx and verbalized understanding of plan. Return to ER w/ new or worsening sx.          Final Clinical Impression(s) / ED Diagnoses Final diagnoses:  None    Rx / DC Orders ED Discharge Orders     None         Raford Pitcher, Horald Chestnut, PA-C 05/02/23 1420    Elayne Snare K, DO 05/02/23 1451

## 2023-05-02 NOTE — Discharge Instructions (Addendum)
You were seen in the emergency room today with urinary frequency.   Your urine analysis did not show any signs of infection. I would recommend follow up with primary care for further workup of symptoms.  Please return to the emergency room with worsening symptoms.

## 2023-05-02 NOTE — ED Triage Notes (Signed)
Pt sent by pcp for urine test for possible UTI  per pt  Reports urinary frequency, denies any pain  States symptoms started on Saturday night
